# Patient Record
Sex: Male | Born: 1995
Health system: Southern US, Community
[De-identification: ages and names within clinical notes are randomized; demographics above are authoritative.]

## PROBLEM LIST (undated history)

## (undated) DIAGNOSIS — W3400XA Accidental discharge from unspecified firearms or gun, initial encounter: Secondary | ICD-10-CM

## (undated) HISTORY — PX: APPENDECTOMY: SHX54

---
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
## 2017-03-13 ENCOUNTER — Inpatient Hospital Stay (HOSPITAL_COMMUNITY)
Admission: EM | Admit: 2017-03-13 | Discharge: 2017-03-19 | DRG: 208 | Payer: Self-pay | Attending: General Surgery | Admitting: General Surgery

## 2017-03-13 ENCOUNTER — Inpatient Hospital Stay (HOSPITAL_COMMUNITY): Payer: Self-pay

## 2017-03-13 ENCOUNTER — Emergency Department (HOSPITAL_COMMUNITY): Payer: Self-pay

## 2017-03-13 ENCOUNTER — Encounter (HOSPITAL_COMMUNITY): Payer: Self-pay | Admitting: Emergency Medicine

## 2017-03-13 DIAGNOSIS — J939 Pneumothorax, unspecified: Secondary | ICD-10-CM

## 2017-03-13 DIAGNOSIS — D62 Acute posthemorrhagic anemia: Secondary | ICD-10-CM | POA: Diagnosis present

## 2017-03-13 DIAGNOSIS — S299XXA Unspecified injury of thorax, initial encounter: Secondary | ICD-10-CM

## 2017-03-13 DIAGNOSIS — I959 Hypotension, unspecified: Secondary | ICD-10-CM | POA: Diagnosis present

## 2017-03-13 DIAGNOSIS — S2241XA Multiple fractures of ribs, right side, initial encounter for closed fracture: Secondary | ICD-10-CM | POA: Diagnosis present

## 2017-03-13 DIAGNOSIS — S41131A Puncture wound without foreign body of right upper arm, initial encounter: Secondary | ICD-10-CM | POA: Diagnosis present

## 2017-03-13 DIAGNOSIS — S272XXA Traumatic hemopneumothorax, initial encounter: Principal | ICD-10-CM | POA: Diagnosis present

## 2017-03-13 DIAGNOSIS — S21131A Puncture wound without foreign body of right front wall of thorax without penetration into thoracic cavity, initial encounter: Secondary | ICD-10-CM | POA: Diagnosis present

## 2017-03-13 DIAGNOSIS — W3400XA Accidental discharge from unspecified firearms or gun, initial encounter: Secondary | ICD-10-CM

## 2017-03-13 DIAGNOSIS — J942 Hemothorax: Secondary | ICD-10-CM

## 2017-03-13 LAB — CBC
HCT: 43.1 % (ref 39.0–52.0)
HEMATOCRIT: 46.8 % (ref 39.0–52.0)
HEMOGLOBIN: 15.1 g/dL (ref 13.0–17.0)
HEMOGLOBIN: 14.3 g/dL (ref 13.0–17.0)
MCH: 28 pg (ref 26.0–34.0)
MCH: 28.3 pg (ref 26.0–34.0)
MCHC: 32.3 g/dL (ref 30.0–36.0)
MCHC: 33.2 g/dL (ref 30.0–36.0)
MCV: 86.8 fL (ref 78.0–100.0)
MCV: 85.2 fL (ref 78.0–100.0)
Platelets: 318 10*3/uL (ref 150–400)
Platelets: 281 10*3/uL (ref 150–400)
RBC: 5.39 MIL/uL (ref 4.22–5.81)
RBC: 5.06 MIL/uL (ref 4.22–5.81)
RDW: 13.2 % (ref 11.5–15.5)
RDW: 13.5 % (ref 11.5–15.5)
WBC: 12.3 10*3/uL — AB (ref 4.0–10.5)
WBC: 30.5 10*3/uL — AB (ref 4.0–10.5)

## 2017-03-13 LAB — COMPREHENSIVE METABOLIC PANEL
ALBUMIN: 4.3 g/dL (ref 3.5–5.0)
ALK PHOS: 79 U/L (ref 38–126)
ALT: 15 U/L — ABNORMAL LOW (ref 17–63)
ANION GAP: 22 — AB (ref 5–15)
AST: 33 U/L (ref 15–41)
BILIRUBIN TOTAL: 1.2 mg/dL (ref 0.3–1.2)
BUN: 15 mg/dL (ref 6–20)
CALCIUM: 9.1 mg/dL (ref 8.9–10.3)
CO2: 14 mmol/L — ABNORMAL LOW (ref 22–32)
Chloride: 105 mmol/L (ref 101–111)
Creatinine, Ser: 1.51 mg/dL — ABNORMAL HIGH (ref 0.61–1.24)
GFR calc non Af Amer: 27 mL/min — ABNORMAL LOW (ref >60)
GFR, EST AFRICAN AMERICAN: 31 mL/min — AB (ref >60)
GLUCOSE: 130 mg/dL — AB (ref 65–99)
POTASSIUM: 3.3 mmol/L — AB (ref 3.5–5.1)
Sodium: 141 mmol/L (ref 135–145)
TOTAL PROTEIN: 7.3 g/dL (ref 6.5–8.1)

## 2017-03-13 LAB — I-STAT CG4 LACTIC ACID, ED: LACTIC ACID, VENOUS: 13.37 mmol/L — AB (ref 0.5–1.9)

## 2017-03-13 LAB — TRIGLYCERIDES
TRIGLYCERIDES: 82 mg/dL (ref <150)
Triglycerides: 115 mg/dL (ref <150)

## 2017-03-13 LAB — I-STAT TROPONIN, ED: TROPONIN I, POC: 0.01 ng/mL (ref 0.00–0.08)

## 2017-03-13 LAB — PROTIME-INR
INR: 1.09
PROTHROMBIN TIME: 14.1 s (ref 11.4–15.2)

## 2017-03-13 LAB — I-STAT CHEM 8, ED
BUN: 26 mg/dL — AB (ref 6–20)
CALCIUM ION: 1.05 mmol/L — AB (ref 1.15–1.40)
Chloride: 105 mmol/L (ref 101–111)
Creatinine, Ser: 1.4 mg/dL — ABNORMAL HIGH (ref 0.61–1.24)
GLUCOSE: 135 mg/dL — AB (ref 65–99)
HCT: 47 % (ref 39.0–52.0)
Hemoglobin: 16 g/dL (ref 13.0–17.0)
Potassium: 5.2 mmol/L — ABNORMAL HIGH (ref 3.5–5.1)
SODIUM: 140 mmol/L (ref 135–145)
TCO2: 19 mmol/L (ref 0–100)

## 2017-03-13 LAB — ETHANOL

## 2017-03-13 LAB — CDS SEROLOGY

## 2017-03-13 LAB — POCT I-STAT 3, ART BLOOD GAS (G3+)
Acid-base deficit: 4 mmol/L — ABNORMAL HIGH (ref 0.0–2.0)
BICARBONATE: 22.7 mmol/L (ref 20.0–28.0)
O2 Saturation: 92 %
TCO2: 24 mmol/L (ref 0–100)
pCO2 arterial: 45.9 mmHg (ref 32.0–48.0)
pH, Arterial: 7.299 — ABNORMAL LOW (ref 7.350–7.450)
pO2, Arterial: 68 mmHg — ABNORMAL LOW (ref 83.0–108.0)

## 2017-03-13 LAB — ABO/RH: ABO/RH(D): AB POS

## 2017-03-13 MED ORDER — VECURONIUM BROMIDE 10 MG IV SOLR
INTRAVENOUS | Status: AC | PRN
  Administered 2017-03-13: 10 mg via INTRAVENOUS

## 2017-03-13 MED ORDER — ETOMIDATE 2 MG/ML IV SOLN
INTRAVENOUS | Status: AC | PRN
  Administered 2017-03-13: 20 mg via INTRAVENOUS

## 2017-03-13 MED ORDER — VECURONIUM BROMIDE 10 MG IV SOLR
INTRAVENOUS | Status: AC
  Filled 2017-03-13: qty 10

## 2017-03-13 MED ORDER — KCL IN DEXTROSE-NACL 20-5-0.45 MEQ/L-%-% IV SOLN
INTRAVENOUS | Status: DC
  Administered 2017-03-13 – 2017-03-18 (×3): via INTRAVENOUS
  Filled 2017-03-13 (×6): qty 1000

## 2017-03-13 MED ORDER — FENTANYL 2500MCG IN NS 250ML (10MCG/ML) PREMIX INFUSION
25.0000 ug/h | INTRAVENOUS | Status: DC
  Administered 2017-03-13: 25 ug/h via INTRAVENOUS
  Filled 2017-03-13: qty 250

## 2017-03-13 MED ORDER — PROPOFOL 1000 MG/100ML IV EMUL
INTRAVENOUS | Status: AC | PRN
  Administered 2017-03-13: 10 ug/kg/min via INTRAVENOUS

## 2017-03-13 MED ORDER — FENTANYL CITRATE (PF) 100 MCG/2ML IJ SOLN: 50.0000 ug | Freq: Once | INTRAMUSCULAR | Status: DC

## 2017-03-13 MED ORDER — PROPOFOL 1000 MG/100ML IV EMUL
0.0000 ug/kg/min | INTRAVENOUS | Status: DC
  Administered 2017-03-13 (×2): 25 ug/kg/min via INTRAVENOUS
  Administered 2017-03-14: 15 ug/kg/min via INTRAVENOUS
  Filled 2017-03-13 (×2): qty 100

## 2017-03-13 MED ORDER — SODIUM CHLORIDE 0.9 % IV BOLUS (SEPSIS)
1000.0000 mL | Freq: Once | INTRAVENOUS | Status: AC
  Administered 2017-03-13: 1000 mL via INTRAVENOUS

## 2017-03-13 MED ORDER — PROPOFOL 1000 MG/100ML IV EMUL
INTRAVENOUS | Status: AC
  Filled 2017-03-13: qty 100

## 2017-03-13 MED ORDER — ONDANSETRON HCL 4 MG PO TABS: 4.0000 mg | ORAL_TABLET | Freq: Four times a day (QID) | ORAL | Status: DC | PRN

## 2017-03-13 MED ORDER — FENTANYL 2500MCG IN NS 250ML (10MCG/ML) PREMIX INFUSION
25.0000 ug/h | INTRAVENOUS | Status: DC
  Administered 2017-03-13: 75 ug/h via INTRAVENOUS
  Administered 2017-03-14: 150 ug/h via INTRAVENOUS
  Filled 2017-03-13: qty 250

## 2017-03-13 MED ORDER — PANTOPRAZOLE SODIUM 40 MG PO TBEC
40.0000 mg | DELAYED_RELEASE_TABLET | Freq: Every day | ORAL | Status: DC
  Administered 2017-03-16 – 2017-03-19 (×4): 40 mg via ORAL
  Filled 2017-03-13 (×4): qty 1

## 2017-03-13 MED ORDER — SODIUM CHLORIDE 0.9 % IV SOLN
INTRAVENOUS | Status: AC | PRN
  Administered 2017-03-13: 1000 mL via INTRAVENOUS

## 2017-03-13 MED ORDER — PROPOFOL 1000 MG/100ML IV EMUL
0.0000 ug/kg/min | INTRAVENOUS | Status: DC
  Administered 2017-03-13: 15 ug/kg/min via INTRAVENOUS

## 2017-03-13 MED ORDER — FENTANYL BOLUS VIA INFUSION
50.0000 ug | INTRAVENOUS | Status: DC | PRN
  Filled 2017-03-13: qty 50

## 2017-03-13 MED ORDER — PANTOPRAZOLE SODIUM 40 MG IV SOLR
40.0000 mg | Freq: Every day | INTRAVENOUS | Status: DC
  Administered 2017-03-14 – 2017-03-15 (×2): 40 mg via INTRAVENOUS
  Filled 2017-03-13 (×2): qty 40

## 2017-03-13 MED ORDER — IOPAMIDOL (ISOVUE-300) INJECTION 61%
100.0000 mL | Freq: Once | INTRAVENOUS | Status: AC | PRN
  Administered 2017-03-13: 100 mL via INTRAVENOUS

## 2017-03-13 MED ORDER — ORAL CARE MOUTH RINSE
15.0000 mL | Freq: Four times a day (QID) | OROMUCOSAL | Status: DC
  Administered 2017-03-14 (×2): 15 mL via OROMUCOSAL

## 2017-03-13 MED ORDER — ONDANSETRON HCL 4 MG/2ML IJ SOLN
4.0000 mg | Freq: Four times a day (QID) | INTRAMUSCULAR | Status: DC | PRN
  Administered 2017-03-17: 4 mg via INTRAVENOUS
  Filled 2017-03-13: qty 2

## 2017-03-13 MED ORDER — SUCCINYLCHOLINE CHLORIDE 20 MG/ML IJ SOLN
INTRAMUSCULAR | Status: AC | PRN
  Administered 2017-03-13: 120 mg via INTRAVENOUS

## 2017-03-13 MED ORDER — CHLORHEXIDINE GLUCONATE 0.12% ORAL RINSE (MEDLINE KIT)
15.0000 mL | Freq: Two times a day (BID) | OROMUCOSAL | Status: DC
  Administered 2017-03-13 – 2017-03-19 (×8): 15 mL via OROMUCOSAL

## 2017-03-13 MED ORDER — STERILE WATER FOR INJECTION IJ SOLN
INTRAMUSCULAR | Status: AC
  Filled 2017-03-13: qty 10

## 2017-03-13 MED ORDER — FENTANYL BOLUS VIA INFUSION
50.0000 ug | INTRAVENOUS | Status: DC | PRN
  Administered 2017-03-14: 50 ug via INTRAVENOUS
  Filled 2017-03-13: qty 50

## 2017-03-13 NOTE — ED Notes (Signed)
Plasma finished.  
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
## 2017-03-13 NOTE — Progress Notes (Signed)
RT ran ABG using Istat which will not transmit the data.  POC has been alerted.  Results given to RN at 18:55.  ABG results: PH 7.299 CO2 45.9 O2 68 HCO3 22.7 Sat 92

## 2017-03-13 NOTE — ED Notes (Signed)
Second unit of blood finished.

## 2017-03-13 NOTE — ED Notes (Signed)
First unit of blood finished
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
## 2017-03-13 NOTE — ED Notes (Signed)
Unit of plasma started [id number] 18 [id number]
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
## 2017-03-13 NOTE — ED Notes (Signed)
Unit of plasma started [id number] 18 [id number]
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
## 2017-03-13 NOTE — Progress Notes (Signed)
Pt transported on vent from ED to 2H19 without complications.  Vitals remained stable.

## 2017-03-13 NOTE — ED Notes (Signed)
Pt given bolus from fentanyl drip
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
## 2017-03-13 NOTE — ED Notes (Signed)
[facility]

## 2017-03-13 NOTE — ED Notes (Signed)
Unit of blood started [id number]

## 2017-03-13 NOTE — ED Notes (Signed)
Second unit of blood started [id number]

## 2017-03-14 ENCOUNTER — Inpatient Hospital Stay (HOSPITAL_COMMUNITY): Payer: Self-pay

## 2017-03-14 LAB — BASIC METABOLIC PANEL
ANION GAP: 9 (ref 5–15)
BUN: 14 mg/dL (ref 6–20)
CHLORIDE: 107 mmol/L (ref 101–111)
CO2: 23 mmol/L (ref 22–32)
Calcium: 8.1 mg/dL — ABNORMAL LOW (ref 8.9–10.3)
Creatinine, Ser: 0.93 mg/dL (ref 0.61–1.24)
GFR calc non Af Amer: >60 mL/min (ref >60)
Glucose, Bld: 119 mg/dL — ABNORMAL HIGH (ref 65–99)
POTASSIUM: 4.3 mmol/L (ref 3.5–5.1)
SODIUM: 139 mmol/L (ref 135–145)

## 2017-03-14 LAB — TYPE AND SCREEN
ABO/RH(D): AB POS
Antibody Screen: NEGATIVE
UNIT DIVISION: 0
UNIT DIVISION: 0
Unit division: 0
Unit division: 0

## 2017-03-14 LAB — URINALYSIS, ROUTINE W REFLEX MICROSCOPIC
BILIRUBIN URINE: NEGATIVE
Bacteria, UA: NONE SEEN
Glucose, UA: NEGATIVE mg/dL
Hgb urine dipstick: NEGATIVE
KETONES UR: NEGATIVE mg/dL
Leukocytes, UA: NEGATIVE
Nitrite: NEGATIVE
PROTEIN: NEGATIVE mg/dL
SQUAMOUS EPITHELIAL / LPF: NONE SEEN
Specific Gravity, Urine: 1.038 — ABNORMAL HIGH (ref 1.005–1.030)
pH: 5 (ref 5.0–8.0)

## 2017-03-14 LAB — BPAM RBC
BLOOD PRODUCT EXPIRATION DATE: 201804022359
BLOOD PRODUCT EXPIRATION DATE: 201804032359
Blood Product Expiration Date: 201804022359
Blood Product Expiration Date: 201804032359
ISSUE DATE / TIME: 201803271609
ISSUE DATE / TIME: 201803271601
ISSUE DATE / TIME: 201803271601
ISSUE DATE / TIME: 201803280313
UNIT TYPE AND RH: 9500
Unit Type and Rh: 9500
Unit Type and Rh: 9500
Unit Type and Rh: 9500

## 2017-03-14 LAB — BPAM FFP
BLOOD PRODUCT EXPIRATION DATE: 201803312359
BLOOD PRODUCT EXPIRATION DATE: 201803312359
ISSUE DATE / TIME: 201803271602
ISSUE DATE / TIME: 201803271602
UNIT TYPE AND RH: 6200
Unit Type and Rh: 6200

## 2017-03-14 LAB — CBC
HEMATOCRIT: 38.2 % — AB (ref 39.0–52.0)
HEMOGLOBIN: 12.9 g/dL — AB (ref 13.0–17.0)
MCH: 28.5 pg (ref 26.0–34.0)
MCHC: 33.8 g/dL (ref 30.0–36.0)
MCV: 84.3 fL (ref 78.0–100.0)
Platelets: 247 10*3/uL (ref 150–400)
RBC: 4.53 MIL/uL (ref 4.22–5.81)
RDW: 13.9 % (ref 11.5–15.5)
WBC: 22.3 10*3/uL — AB (ref 4.0–10.5)

## 2017-03-14 LAB — PREPARE FRESH FROZEN PLASMA
UNIT DIVISION: 0
Unit division: 0

## 2017-03-14 LAB — MRSA PCR SCREENING: MRSA BY PCR: NEGATIVE

## 2017-03-14 LAB — BLOOD PRODUCT ORDER (VERBAL) VERIFICATION

## 2017-03-14 MED ORDER — HYDROMORPHONE HCL 1 MG/ML IJ SOLN
1.0000 mg | INTRAMUSCULAR | Status: DC | PRN
  Administered 2017-03-14 – 2017-03-15 (×6): 2 mg via INTRAVENOUS
  Administered 2017-03-16: 1 mg via INTRAVENOUS
  Administered 2017-03-16: 2 mg via INTRAVENOUS
  Administered 2017-03-16 (×2): 1 mg via INTRAVENOUS
  Administered 2017-03-17: 2 mg via INTRAVENOUS
  Administered 2017-03-17 (×2): 1 mg via INTRAVENOUS
  Administered 2017-03-17: 2 mg via INTRAVENOUS
  Administered 2017-03-18 (×3): 1 mg via INTRAVENOUS
  Filled 2017-03-14: qty 1
  Filled 2017-03-14 (×2): qty 2
  Filled 2017-03-14: qty 1
  Filled 2017-03-14 (×3): qty 2
  Filled 2017-03-14: qty 1
  Filled 2017-03-14 (×4): qty 2
  Filled 2017-03-14 (×2): qty 1
  Filled 2017-03-14: qty 2
  Filled 2017-03-14 (×3): qty 1

## 2017-03-14 NOTE — Procedures (Signed)
Extubation Procedure Note  Patient Details:   Name: Ryan Downs DOB: 07/13/1996 MRN: [id number]   Airway Documentation:     Evaluation  O2 sats: stable throughout Complications: No apparent complications Patient did tolerate procedure well. Bilateral Breath Sounds: Clear   Yes   Patient extubated to 2L nasal cannula per MD order.  Positive cuff leak noted.  No evidence of stridor.  Patient able to speak post extubation.  Sats currently 100%.  Vitals are stable.  No complications noted.   Ledell NossBrown, Shaelin Lalley N 03/14/2017, 9:05 AM
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
## 2017-03-15 ENCOUNTER — Inpatient Hospital Stay (HOSPITAL_COMMUNITY): Payer: Self-pay

## 2017-03-15 LAB — BASIC METABOLIC PANEL
ANION GAP: 5 (ref 5–15)
BUN: <5 mg/dL — ABNORMAL LOW (ref 6–20)
CHLORIDE: 102 mmol/L (ref 101–111)
CO2: 27 mmol/L (ref 22–32)
Calcium: 8.3 mg/dL — ABNORMAL LOW (ref 8.9–10.3)
Creatinine, Ser: 0.76 mg/dL (ref 0.61–1.24)
GFR calc non Af Amer: >60 mL/min (ref >60)
GLUCOSE: 136 mg/dL — AB (ref 65–99)
Potassium: 4 mmol/L (ref 3.5–5.1)
Sodium: 134 mmol/L — ABNORMAL LOW (ref 135–145)

## 2017-03-15 LAB — CBC WITH DIFFERENTIAL/PLATELET
BASOS ABS: 0 10*3/uL (ref 0.0–0.1)
BASOS PCT: 0 %
Eosinophils Absolute: 0.1 10*3/uL (ref 0.0–0.7)
Eosinophils Relative: 1 %
HEMATOCRIT: 34.5 % — AB (ref 39.0–52.0)
HEMOGLOBIN: 11.3 g/dL — AB (ref 13.0–17.0)
LYMPHS PCT: 12 %
Lymphs Abs: 1.9 10*3/uL (ref 0.7–4.0)
MCH: 27.6 pg (ref 26.0–34.0)
MCHC: 32.8 g/dL (ref 30.0–36.0)
MCV: 84.4 fL (ref 78.0–100.0)
Monocytes Absolute: 1.9 10*3/uL — ABNORMAL HIGH (ref 0.1–1.0)
Monocytes Relative: 12 %
NEUTROS ABS: 12.2 10*3/uL — AB (ref 1.7–7.7)
NEUTROS PCT: 75 %
Platelets: 220 10*3/uL (ref 150–400)
RBC: 4.09 MIL/uL — ABNORMAL LOW (ref 4.22–5.81)
RDW: 13.6 % (ref 11.5–15.5)
WBC: 16.1 10*3/uL — ABNORMAL HIGH (ref 4.0–10.5)

## 2017-03-15 MED ORDER — OXYCODONE HCL 5 MG PO TABS
5.0000 mg | ORAL_TABLET | ORAL | Status: DC | PRN
  Administered 2017-03-15 – 2017-03-16 (×4): 10 mg via ORAL
  Administered 2017-03-16: 15 mg via ORAL
  Administered 2017-03-16: 10 mg via ORAL
  Administered 2017-03-16: 15 mg via ORAL
  Administered 2017-03-16: 10 mg via ORAL
  Administered 2017-03-17 – 2017-03-19 (×12): 15 mg via ORAL
  Filled 2017-03-15: qty 2
  Filled 2017-03-15 (×3): qty 3
  Filled 2017-03-15: qty 2
  Filled 2017-03-15 (×5): qty 3
  Filled 2017-03-15: qty 2
  Filled 2017-03-15: qty 3
  Filled 2017-03-15: qty 2
  Filled 2017-03-15 (×2): qty 3
  Filled 2017-03-15 (×2): qty 2
  Filled 2017-03-15: qty 3
  Filled 2017-03-15: qty 2
  Filled 2017-03-15 (×2): qty 3

## 2017-03-15 NOTE — Progress Notes (Signed)
Report called to 6N
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
## 2017-03-16 ENCOUNTER — Inpatient Hospital Stay (HOSPITAL_COMMUNITY): Payer: Self-pay

## 2017-03-16 LAB — CBC
HCT: 35.8 % — ABNORMAL LOW (ref 39.0–52.0)
Hemoglobin: 11.6 g/dL — ABNORMAL LOW (ref 13.0–17.0)
MCH: 27.1 pg (ref 26.0–34.0)
MCHC: 32.4 g/dL (ref 30.0–36.0)
MCV: 83.6 fL (ref 78.0–100.0)
PLATELETS: 229 10*3/uL (ref 150–400)
RBC: 4.28 MIL/uL (ref 4.22–5.81)
RDW: 13 % (ref 11.5–15.5)
WBC: 15.6 10*3/uL — ABNORMAL HIGH (ref 4.0–10.5)

## 2017-03-16 LAB — TRIGLYCERIDES: TRIGLYCERIDES: 96 mg/dL (ref <150)
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
## 2017-03-17 ENCOUNTER — Inpatient Hospital Stay (HOSPITAL_COMMUNITY): Payer: Self-pay

## 2017-03-17 MED ORDER — POLYETHYLENE GLYCOL 3350 17 G PO PACK
17.0000 g | PACK | Freq: Every day | ORAL | Status: DC
  Administered 2017-03-18: 17 g via ORAL
  Filled 2017-03-17 (×2): qty 1

## 2017-03-17 MED ORDER — WHITE PETROLATUM GEL
Status: DC | PRN
  Administered 2017-03-17: 20:00:00 via TOPICAL
  Filled 2017-03-17: qty 1

## 2017-03-17 NOTE — Progress Notes (Signed)
Patient gait was a little unstable, will continue to use walker while ambulating until gait stables.  [name], [place] [address]
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
## 2017-03-18 LAB — CBC
HEMATOCRIT: 31 % — AB (ref 39.0–52.0)
Hemoglobin: 10.6 g/dL — ABNORMAL LOW (ref 13.0–17.0)
MCH: 27.9 pg (ref 26.0–34.0)
MCHC: 34.2 g/dL (ref 30.0–36.0)
MCV: 81.6 fL (ref 78.0–100.0)
Platelets: 303 10*3/uL (ref 150–400)
RBC: 3.8 MIL/uL — ABNORMAL LOW (ref 4.22–5.81)
RDW: 13 % (ref 11.5–15.5)
WBC: 14.7 10*3/uL — ABNORMAL HIGH (ref 4.0–10.5)
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
## 2017-03-19 ENCOUNTER — Emergency Department (HOSPITAL_COMMUNITY)
Admission: EM | Admit: 2017-03-19 | Discharge: 2017-03-19 | Disposition: A | Discharge status: Home / Self Care | Attending: Emergency Medicine | Admitting: Emergency Medicine

## 2017-03-19 ENCOUNTER — Encounter (HOSPITAL_COMMUNITY): Payer: Self-pay

## 2017-03-19 ENCOUNTER — Emergency Department (HOSPITAL_COMMUNITY)

## 2017-03-19 DIAGNOSIS — R091 Pleurisy: Secondary | ICD-10-CM | POA: Insufficient documentation

## 2017-03-19 DIAGNOSIS — Z87891 Personal history of nicotine dependence: Secondary | ICD-10-CM | POA: Insufficient documentation

## 2017-03-19 MED ORDER — OXYCODONE-ACETAMINOPHEN 5-325 MG PO TABS
1.0000 | ORAL_TABLET | Freq: Once | ORAL | Status: AC
  Administered 2017-03-19: 1 via ORAL
  Filled 2017-03-19: qty 1

## 2017-03-19 MED ORDER — KETOROLAC TROMETHAMINE 30 MG/ML IJ SOLN
30.0000 mg | Freq: Once | INTRAMUSCULAR | Status: AC
  Administered 2017-03-19: 30 mg via INTRAVENOUS
  Filled 2017-03-19: qty 1

## 2017-03-19 MED ORDER — OXYCODONE HCL 5 MG PO TABS: 5.0000 mg | ORAL_TABLET | ORAL | 0 refills | Status: DC | PRN
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
## 2017-03-19 NOTE — ED Notes (Signed)
Got patient on the monitor got vitals patient resting
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
## 2017-03-19 NOTE — Discharge Instructions (Signed)
You may shower tomorrow and remove your bandages after showering. Replace them daily after showering. Keep your wounds clean and dry.   Follow up in our office in 2 weeks. Call to schedule an appointment.   Call our office if you experience worsening cough, fever, chills, redness, swelling or foul discharge from your wounds.    1. PAIN CONTROL:  1. Pain is best controlled by a usual combination of three different methods TOGETHER:  1. Ice/Heat 2. Over the counter pain medication 3. Prescription pain medication 2. It is helpful to take an over-the-counter pain medication regularly for the first few weeks.  1. Acetaminophen (Tylenol, etc) 500-650mg  four times a day (every meal & bedtime) 3. A prescription for pain medication (such as oxycodone, hydrocodone, etc) should be given to you upon discharge. Take your pain medication as prescribed.  1. If you are having problems/concerns with the prescription medicine (does not control pain, nausea, vomiting, rash, itching, etc), please call us [phone] to see if we need to switch you to a different pain medicine that will work better for you and/or control your side effect better. 2. If you need a refill on your pain medication, please contact your pharmacy. They will contact our office to request authorization. Prescriptions will not be filled after 5 pm or on week-ends. 4. Avoid getting constipated. While taking narcotic pain medication it is common to experience some constipation. Increasing fluid intake and taking a fiber supplement (such as Metamucil, Citrucel, FiberCon, MiraLax, etc) 1-2 times a day regularly will usually help prevent this problem from occurring. A mild laxative (prune juice, Milk of Magnesia, MiraLax, etc) should be taken according to package directions if there are no bowel movements after 48 hours.    5. FOLLOW UP in our office  1. Please call [facility] at [phone] to set up an appointment in approximately 2-3 weeks.    10. IF YOU HAVE DISABILITY OR FAMILY LEAVE FORMS, BRING THEM TO THE               OFFICE FOR PROCESSING.   WHEN TO CALL us [phone]:  1. Poor pain control 2. Reactions / problems with new medications (rash/itching, nausea, etc)  3. Fever over 101.5 F (38.5 C) 4. Inability to urinate 5. Nausea and/or vomiting 6. Worsening swelling or bruising 7. Continued bleeding from incision. 8. Increased pain, redness, or drainage from wounds  The clinic staff is available to answer your questions during regular business hours (8:30am-5pm). Please dont hesitate to call and ask to speak to one of our nurses for clinical concerns.  If you have a medical emergency, go to the nearest emergency room or call 911.  A surgeon from [facility] is always on call at the [facility], [place]  [address], [place], [place] [address] ?  MAIN: [phone] ? TOLL FREE: [phone] ?  FAX [phone]  [email or web address]
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
## 2017-04-02 ENCOUNTER — Encounter (HOSPITAL_COMMUNITY): Payer: Self-pay | Admitting: Emergency Medicine

## 2017-04-02 ENCOUNTER — Emergency Department (HOSPITAL_COMMUNITY)
Admission: EM | Admit: 2017-04-02 | Discharge: 2017-04-03 | Payer: Self-pay | Attending: Emergency Medicine | Admitting: Emergency Medicine

## 2017-04-02 ENCOUNTER — Emergency Department (HOSPITAL_COMMUNITY): Payer: Self-pay

## 2017-04-02 DIAGNOSIS — Z4801 Encounter for change or removal of surgical wound dressing: Secondary | ICD-10-CM | POA: Insufficient documentation

## 2017-04-02 DIAGNOSIS — Z87891 Personal history of nicotine dependence: Secondary | ICD-10-CM | POA: Insufficient documentation

## 2017-04-02 DIAGNOSIS — Z5189 Encounter for other specified aftercare: Secondary | ICD-10-CM

## 2017-04-02 HISTORY — DX: Accidental discharge from unspecified firearms or gun, initial encounter: W34.00XA

## 2017-04-02 LAB — I-STAT TROPONIN, ED: TROPONIN I, POC: 0 ng/mL (ref 0.00–0.08)

## 2017-04-02 LAB — BASIC METABOLIC PANEL
Anion gap: 10 (ref 5–15)
BUN: 8 mg/dL (ref 6–20)
CALCIUM: 9.7 mg/dL (ref 8.9–10.3)
CO2: 23 mmol/L (ref 22–32)
CREATININE: 0.97 mg/dL (ref 0.61–1.24)
Chloride: 103 mmol/L (ref 101–111)
GFR calc Af Amer: >60 mL/min (ref >60)
GLUCOSE: 96 mg/dL (ref 65–99)
Potassium: 4 mmol/L (ref 3.5–5.1)
Sodium: 136 mmol/L (ref 135–145)

## 2017-04-02 LAB — CBC
HCT: 39.4 % (ref 39.0–52.0)
Hemoglobin: 13.2 g/dL (ref 13.0–17.0)
MCH: 27.8 pg (ref 26.0–34.0)
MCHC: 33.5 g/dL (ref 30.0–36.0)
MCV: 83.1 fL (ref 78.0–100.0)
PLATELETS: 549 10*3/uL — AB (ref 150–400)
RBC: 4.74 MIL/uL (ref 4.22–5.81)
RDW: 13.7 % (ref 11.5–15.5)
WBC: 10.6 10*3/uL — ABNORMAL HIGH (ref 4.0–10.5)

## 2017-04-02 MED ORDER — CLINDAMYCIN HCL 300 MG PO CAPS: 300.0000 mg | ORAL_CAPSULE | Freq: Four times a day (QID) | ORAL | 0 refills | Status: AC

## 2017-04-02 MED ORDER — IBUPROFEN 400 MG PO TABS
400.0000 mg | ORAL_TABLET | Freq: Once | ORAL | Status: AC
  Administered 2017-04-02: 400 mg via ORAL
  Filled 2017-04-02: qty 1

## 2017-04-02 MED ORDER — IOPAMIDOL (ISOVUE-300) INJECTION 61%
INTRAVENOUS | Status: AC
  Administered 2017-04-02: 75 mL
  Filled 2017-04-02: qty 75

## 2017-04-02 MED ORDER — CLINDAMYCIN HCL 150 MG PO CAPS
300.0000 mg | ORAL_CAPSULE | Freq: Once | ORAL | Status: AC
  Administered 2017-04-02: 300 mg via ORAL
  Filled 2017-04-02: qty 2

## 2017-04-02 MED ORDER — MELOXICAM 7.5 MG PO TABS: 7.5000 mg | ORAL_TABLET | Freq: Every day | ORAL | 0 refills | Status: AC
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
## 2017-04-02 NOTE — ED Notes (Signed)
Patient transported to CT 
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
## 2018-03-14 IMAGING — CR DG CHEST 1V PORT
1 series · 1 of 1 positions shown · non-contrast
Comparison: Chest radiograph and chest CT March 13, 2017

CLINICAL DATA: Hypoxia

EXAM:
PORTABLE CHEST 1 VIEW

[AP]
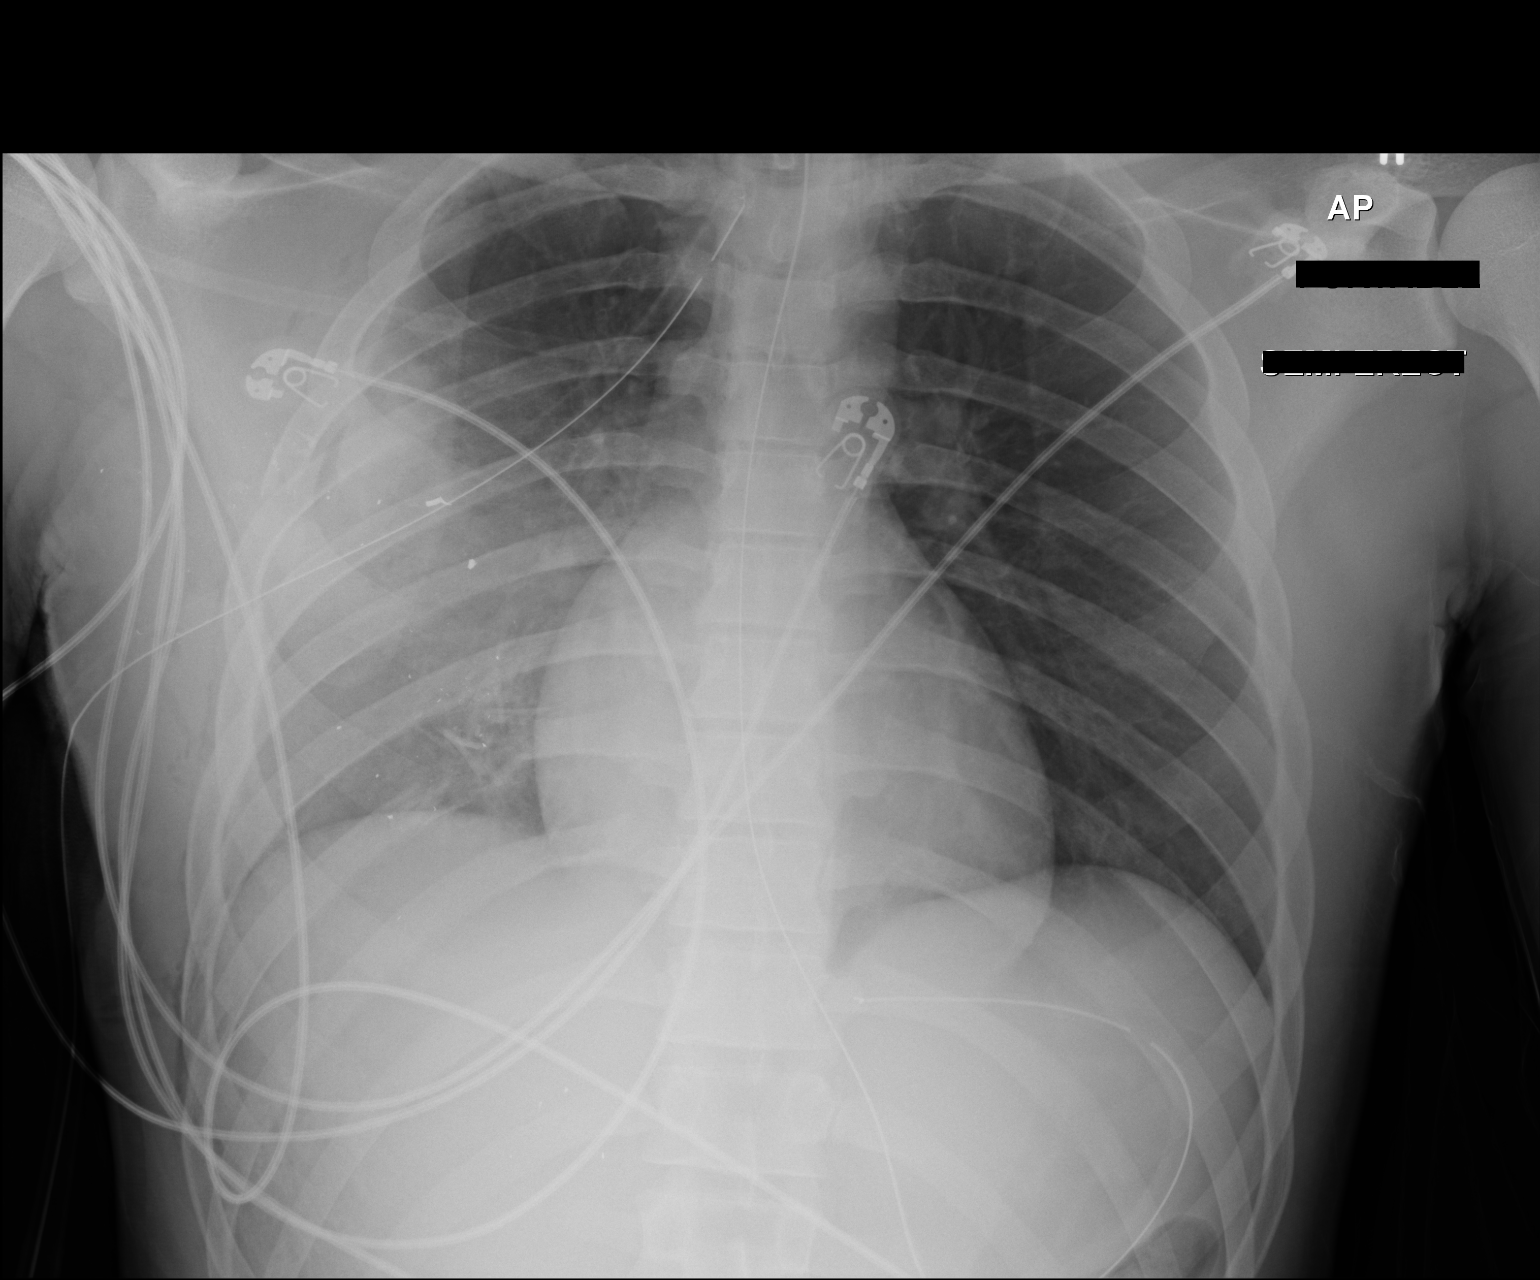

[1 of 1 positions shown; findings below may reference images not displayed]

FINDINGS: Endotracheal tube tip is 5.7 cm above the carina. Chest tube is
present on the right. Nasogastric tube tip and side port are in the
stomach. There is a small lateral pneumothorax on the right without
tension component. There is moderate consolidation throughout much
of the right mid lower lung zones, essentially stable. There is a
small right pleural effusion. There are metallic fragments on the
right.

Left lung is clear. Heart size and pulmonary vascularity are normal.
No adenopathy. Subcutaneous air on the right is again noted.
Comminuted posterior right ninth rib fracture present.
IMPRESSION: Tube and catheter positions as described. Small right-sided
pneumothorax. Small right pleural effusion as well.
Consolidations/contusion on the right stable. Stable comminuted
fracture posterior right ninth rib. Left lung clear. Stable cardiac
silhouette.
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
## 2020-08-18 DIAGNOSIS — W3400XA Accidental discharge from unspecified firearms or gun, initial encounter: Secondary | ICD-10-CM

## 2020-08-18 HISTORY — DX: Accidental discharge from unspecified firearms or gun, initial encounter: W34.00XA

## 2020-08-23 ENCOUNTER — Inpatient Hospital Stay (HOSPITAL_COMMUNITY): Payer: Self-pay

## 2020-08-23 ENCOUNTER — Encounter (HOSPITAL_COMMUNITY): Payer: Self-pay | Admitting: *Deleted

## 2020-08-23 ENCOUNTER — Inpatient Hospital Stay (HOSPITAL_COMMUNITY): Payer: Self-pay | Admitting: Registered Nurse

## 2020-08-23 ENCOUNTER — Inpatient Hospital Stay (HOSPITAL_COMMUNITY)
Admission: EM | Admit: 2020-08-23 | Discharge: 2020-08-25 | DRG: 481 | Disposition: A | Discharge status: Home Health | Payer: Self-pay | Attending: Student | Admitting: Student

## 2020-08-23 ENCOUNTER — Emergency Department (HOSPITAL_COMMUNITY): Payer: Self-pay

## 2020-08-23 ENCOUNTER — Encounter (HOSPITAL_COMMUNITY)
Admission: EM | Disposition: A | Discharge status: Home Health | Payer: Self-pay | Source: Home / Self Care | Attending: Student

## 2020-08-23 ENCOUNTER — Other Ambulatory Visit: Payer: Self-pay

## 2020-08-23 DIAGNOSIS — Z20822 Contact with and (suspected) exposure to covid-19: Secondary | ICD-10-CM | POA: Diagnosis present

## 2020-08-23 DIAGNOSIS — W3400XA Accidental discharge from unspecified firearms or gun, initial encounter: Secondary | ICD-10-CM

## 2020-08-23 DIAGNOSIS — S7291XA Unspecified fracture of right femur, initial encounter for closed fracture: Secondary | ICD-10-CM

## 2020-08-23 DIAGNOSIS — Z419 Encounter for procedure for purposes other than remedying health state, unspecified: Secondary | ICD-10-CM

## 2020-08-23 DIAGNOSIS — T1490XA Injury, unspecified, initial encounter: Secondary | ICD-10-CM

## 2020-08-23 DIAGNOSIS — F17200 Nicotine dependence, unspecified, uncomplicated: Secondary | ICD-10-CM | POA: Diagnosis present

## 2020-08-23 DIAGNOSIS — S7290XA Unspecified fracture of unspecified femur, initial encounter for closed fracture: Secondary | ICD-10-CM

## 2020-08-23 DIAGNOSIS — S7221XA Displaced subtrochanteric fracture of right femur, initial encounter for closed fracture: Principal | ICD-10-CM

## 2020-08-23 DIAGNOSIS — D62 Acute posthemorrhagic anemia: Secondary | ICD-10-CM | POA: Diagnosis not present

## 2020-08-23 DIAGNOSIS — S81831A Puncture wound without foreign body, right lower leg, initial encounter: Secondary | ICD-10-CM

## 2020-08-23 DIAGNOSIS — S0285XA Fracture of orbit, unspecified, initial encounter for closed fracture: Secondary | ICD-10-CM

## 2020-08-23 DIAGNOSIS — S01112A Laceration without foreign body of left eyelid and periocular area, initial encounter: Secondary | ICD-10-CM

## 2020-08-23 HISTORY — DX: Accidental discharge from unspecified firearms or gun, initial encounter: W34.00XA

## 2020-08-23 HISTORY — PX: FEMUR IM NAIL: SHX1597

## 2020-08-23 LAB — CBC
HCT: 44.5 % (ref 39.0–52.0)
HCT: 37.6 % — ABNORMAL LOW (ref 39.0–52.0)
Hemoglobin: 14.4 g/dL (ref 13.0–17.0)
Hemoglobin: 12 g/dL — ABNORMAL LOW (ref 13.0–17.0)
MCH: 27.9 pg (ref 26.0–34.0)
MCH: 27.1 pg (ref 26.0–34.0)
MCHC: 32.4 g/dL (ref 30.0–36.0)
MCHC: 31.9 g/dL (ref 30.0–36.0)
MCV: 86.1 fL (ref 80.0–100.0)
MCV: 84.9 fL (ref 80.0–100.0)
Platelets: 287 10*3/uL (ref 150–400)
Platelets: 239 10*3/uL (ref 150–400)
RBC: 5.17 MIL/uL (ref 4.22–5.81)
RBC: 4.43 MIL/uL (ref 4.22–5.81)
RDW: 12.7 % (ref 11.5–15.5)
RDW: 12.7 % (ref 11.5–15.5)
WBC: 17.3 10*3/uL — ABNORMAL HIGH (ref 4.0–10.5)
WBC: 15.7 10*3/uL — ABNORMAL HIGH (ref 4.0–10.5)
nRBC: 0 % (ref 0.0–0.2)
nRBC: 0 % (ref 0.0–0.2)

## 2020-08-23 LAB — COMPREHENSIVE METABOLIC PANEL
ALT: 13 U/L (ref 0–44)
AST: 24 U/L (ref 15–41)
Albumin: 4.2 g/dL (ref 3.5–5.0)
Alkaline Phosphatase: 77 U/L (ref 38–126)
Anion gap: 16 — ABNORMAL HIGH (ref 5–15)
BUN: 11 mg/dL (ref 6–20)
CO2: 19 mmol/L — ABNORMAL LOW (ref 22–32)
Calcium: 9 mg/dL (ref 8.9–10.3)
Chloride: 105 mmol/L (ref 98–111)
Creatinine, Ser: 1.32 mg/dL — ABNORMAL HIGH (ref 0.61–1.24)
GFR calc Af Amer: >60 mL/min (ref >60)
GFR calc non Af Amer: >60 mL/min (ref >60)
Glucose, Bld: 143 mg/dL — ABNORMAL HIGH (ref 70–99)
Potassium: 3.1 mmol/L — ABNORMAL LOW (ref 3.5–5.1)
Sodium: 140 mmol/L (ref 135–145)
Total Bilirubin: 1.3 mg/dL — ABNORMAL HIGH (ref 0.3–1.2)
Total Protein: 6.7 g/dL (ref 6.5–8.1)

## 2020-08-23 LAB — I-STAT CHEM 8, ED
BUN: 13 mg/dL (ref 6–20)
Calcium, Ion: 0.93 mmol/L — ABNORMAL LOW (ref 1.15–1.40)
Chloride: 106 mmol/L (ref 98–111)
Creatinine, Ser: 1 mg/dL (ref 0.61–1.24)
Glucose, Bld: 132 mg/dL — ABNORMAL HIGH (ref 70–99)
HCT: 44 % (ref 39.0–52.0)
Hemoglobin: 15 g/dL (ref 13.0–17.0)
Potassium: 3.2 mmol/L — ABNORMAL LOW (ref 3.5–5.1)
Sodium: 140 mmol/L (ref 135–145)
TCO2: 20 mmol/L — ABNORMAL LOW (ref 22–32)

## 2020-08-23 LAB — LACTIC ACID, PLASMA: Lactic Acid, Venous: 3.3 mmol/L (ref 0.5–1.9)

## 2020-08-23 LAB — SURGICAL PCR SCREEN
MRSA, PCR: NEGATIVE
Staphylococcus aureus: NEGATIVE

## 2020-08-23 LAB — SARS CORONAVIRUS 2 BY RT PCR (HOSPITAL ORDER, PERFORMED IN ~~LOC~~ HOSPITAL LAB): SARS Coronavirus 2: NEGATIVE

## 2020-08-23 LAB — PROTIME-INR
INR: 1.2 (ref 0.8–1.2)
Prothrombin Time: 14.4 seconds (ref 11.4–15.2)

## 2020-08-23 LAB — SAMPLE TO BLOOD BANK

## 2020-08-23 LAB — ETHANOL: Alcohol, Ethyl (B): <10 mg/dL (ref <10)

## 2020-08-23 LAB — CREATININE, SERUM
Creatinine, Ser: 0.91 mg/dL (ref 0.61–1.24)
GFR calc Af Amer: >60 mL/min (ref >60)
GFR calc non Af Amer: >60 mL/min (ref >60)

## 2020-08-23 SURGERY — INSERTION, INTRAMEDULLARY ROD, FEMUR
Anesthesia: General | Laterality: Right

## 2020-08-23 MED ORDER — LACTATED RINGERS IV SOLN: INTRAVENOUS | Status: DC | PRN

## 2020-08-23 MED ORDER — OXYCODONE HCL 5 MG PO TABS
5.0000 mg | ORAL_TABLET | ORAL | Status: DC | PRN
  Administered 2020-08-23: 10 mg via ORAL
  Administered 2020-08-23 – 2020-08-25 (×2): 5 mg via ORAL
  Administered 2020-08-25: 10 mg via ORAL
  Administered 2020-08-25: 5 mg via ORAL
  Filled 2020-08-23 (×2): qty 2
  Filled 2020-08-23: qty 1
  Filled 2020-08-23 (×3): qty 2

## 2020-08-23 MED ORDER — MIDAZOLAM HCL 5 MG/5ML IJ SOLN
INTRAMUSCULAR | Status: DC | PRN
  Administered 2020-08-23: 2 mg via INTRAVENOUS

## 2020-08-23 MED ORDER — ACETAMINOPHEN 325 MG PO TABS
650.0000 mg | ORAL_TABLET | Freq: Four times a day (QID) | ORAL | Status: DC
  Administered 2020-08-23 – 2020-08-25 (×9): 650 mg via ORAL
  Filled 2020-08-23 (×9): qty 2

## 2020-08-23 MED ORDER — VANCOMYCIN HCL 1000 MG IV SOLR
INTRAVENOUS | Status: AC
  Filled 2020-08-23: qty 1000

## 2020-08-23 MED ORDER — METHOCARBAMOL 500 MG PO TABS
ORAL_TABLET | ORAL | Status: AC
  Administered 2020-08-23: 500 mg via ORAL
  Filled 2020-08-23: qty 1

## 2020-08-23 MED ORDER — MUPIROCIN 2 % EX OINT
1.0000 "application " | TOPICAL_OINTMENT | Freq: Two times a day (BID) | CUTANEOUS | Status: DC
  Administered 2020-08-24 – 2020-08-25 (×3): 1 via NASAL
  Filled 2020-08-23 (×2): qty 22

## 2020-08-23 MED ORDER — LIDOCAINE 2% (20 MG/ML) 5 ML SYRINGE
INTRAMUSCULAR | Status: DC | PRN
  Administered 2020-08-23: 80 mg via INTRAVENOUS

## 2020-08-23 MED ORDER — FENTANYL CITRATE (PF) 100 MCG/2ML IJ SOLN
25.0000 ug | INTRAMUSCULAR | Status: DC | PRN
  Administered 2020-08-23: 50 ug via INTRAVENOUS

## 2020-08-23 MED ORDER — POLYETHYLENE GLYCOL 3350 17 G PO PACK: 17.0000 g | PACK | Freq: Every day | ORAL | Status: DC | PRN

## 2020-08-23 MED ORDER — DEXMEDETOMIDINE (PRECEDEX) IN NS 20 MCG/5ML (4 MCG/ML) IV SYRINGE
PREFILLED_SYRINGE | INTRAVENOUS | Status: DC | PRN
  Administered 2020-08-23: 8 ug via INTRAVENOUS
  Administered 2020-08-23: 4 ug via INTRAVENOUS
  Administered 2020-08-23: 8 ug via INTRAVENOUS

## 2020-08-23 MED ORDER — METHOCARBAMOL 500 MG PO TABS
500.0000 mg | ORAL_TABLET | Freq: Four times a day (QID) | ORAL | Status: DC | PRN
  Administered 2020-08-23 – 2020-08-25 (×4): 500 mg via ORAL
  Filled 2020-08-23 (×4): qty 1

## 2020-08-23 MED ORDER — LACTATED RINGERS IV BOLUS
2000.0000 mL | Freq: Once | INTRAVENOUS | Status: AC
  Administered 2020-08-23: 2000 mL via INTRAVENOUS

## 2020-08-23 MED ORDER — ROCURONIUM BROMIDE 10 MG/ML (PF) SYRINGE
PREFILLED_SYRINGE | INTRAVENOUS | Status: DC | PRN
  Administered 2020-08-23: 50 mg via INTRAVENOUS

## 2020-08-23 MED ORDER — ROCURONIUM BROMIDE 10 MG/ML (PF) SYRINGE
PREFILLED_SYRINGE | INTRAVENOUS | Status: AC
  Filled 2020-08-23: qty 10

## 2020-08-23 MED ORDER — CEFAZOLIN SODIUM 1 G IJ SOLR
INTRAMUSCULAR | Status: AC
  Filled 2020-08-23: qty 20

## 2020-08-23 MED ORDER — FENTANYL CITRATE (PF) 100 MCG/2ML IJ SOLN
100.0000 ug | Freq: Once | INTRAMUSCULAR | Status: AC
  Administered 2020-08-23: 100 ug via INTRAVENOUS
  Filled 2020-08-23: qty 2

## 2020-08-23 MED ORDER — ONDANSETRON HCL 4 MG/2ML IJ SOLN
INTRAMUSCULAR | Status: AC
  Filled 2020-08-23: qty 2

## 2020-08-23 MED ORDER — HYDROMORPHONE HCL 1 MG/ML IJ SOLN
0.5000 mg | INTRAMUSCULAR | Status: DC | PRN
  Administered 2020-08-23 – 2020-08-24 (×3): 1 mg via INTRAVENOUS
  Filled 2020-08-23 (×3): qty 1

## 2020-08-23 MED ORDER — LIDOCAINE-EPINEPHRINE-TETRACAINE (LET) TOPICAL GEL
3.0000 mL | Freq: Once | TOPICAL | Status: AC
  Administered 2020-08-23: 3 mL via TOPICAL
  Filled 2020-08-23: qty 3

## 2020-08-23 MED ORDER — PROPOFOL 10 MG/ML IV BOLUS
INTRAVENOUS | Status: AC
  Filled 2020-08-23: qty 20

## 2020-08-23 MED ORDER — VANCOMYCIN HCL 1000 MG IV SOLR
INTRAVENOUS | Status: DC | PRN
  Administered 2020-08-23: 1000 mg

## 2020-08-23 MED ORDER — TETANUS-DIPHTH-ACELL PERTUSSIS 5-2.5-18.5 LF-MCG/0.5 IM SUSP: 0.5000 mL | Freq: Once | INTRAMUSCULAR | Status: DC

## 2020-08-23 MED ORDER — IOHEXOL 350 MG/ML SOLN
100.0000 mL | Freq: Once | INTRAVENOUS | Status: AC | PRN
  Administered 2020-08-23: 100 mL via INTRAVENOUS

## 2020-08-23 MED ORDER — PHENYLEPHRINE 40 MCG/ML (10ML) SYRINGE FOR IV PUSH (FOR BLOOD PRESSURE SUPPORT)
PREFILLED_SYRINGE | INTRAVENOUS | Status: AC
  Filled 2020-08-23: qty 10

## 2020-08-23 MED ORDER — METOCLOPRAMIDE HCL 5 MG/ML IJ SOLN: 5.0000 mg | Freq: Three times a day (TID) | INTRAMUSCULAR | Status: DC | PRN

## 2020-08-23 MED ORDER — SUCCINYLCHOLINE CHLORIDE 200 MG/10ML IV SOSY
PREFILLED_SYRINGE | INTRAVENOUS | Status: DC | PRN
  Administered 2020-08-23: 140 mg via INTRAVENOUS

## 2020-08-23 MED ORDER — FENTANYL CITRATE (PF) 100 MCG/2ML IJ SOLN
INTRAMUSCULAR | Status: AC
  Administered 2020-08-23: 50 ug via INTRAVENOUS
  Filled 2020-08-23: qty 2

## 2020-08-23 MED ORDER — ONDANSETRON HCL 4 MG/2ML IJ SOLN
INTRAMUSCULAR | Status: DC | PRN
  Administered 2020-08-23: 4 mg via INTRAVENOUS

## 2020-08-23 MED ORDER — SUCCINYLCHOLINE CHLORIDE 200 MG/10ML IV SOSY
PREFILLED_SYRINGE | INTRAVENOUS | Status: AC
  Filled 2020-08-23: qty 10

## 2020-08-23 MED ORDER — CEFAZOLIN SODIUM-DEXTROSE 2-3 GM-%(50ML) IV SOLR
INTRAVENOUS | Status: DC | PRN
  Administered 2020-08-23: 2 g via INTRAVENOUS

## 2020-08-23 MED ORDER — SUGAMMADEX SODIUM 200 MG/2ML IV SOLN
INTRAVENOUS | Status: DC | PRN
  Administered 2020-08-23 (×2): 100 mg via INTRAVENOUS

## 2020-08-23 MED ORDER — FENTANYL CITRATE (PF) 250 MCG/5ML IJ SOLN
INTRAMUSCULAR | Status: AC
  Filled 2020-08-23: qty 5

## 2020-08-23 MED ORDER — ENOXAPARIN SODIUM 40 MG/0.4ML ~~LOC~~ SOLN
40.0000 mg | SUBCUTANEOUS | Status: DC
  Administered 2020-08-24 – 2020-08-25 (×2): 40 mg via SUBCUTANEOUS
  Filled 2020-08-23 (×2): qty 0.4

## 2020-08-23 MED ORDER — FENTANYL CITRATE (PF) 100 MCG/2ML IJ SOLN
50.0000 ug | INTRAMUSCULAR | Status: DC | PRN
  Administered 2020-08-23: 50 ug via INTRAVENOUS
  Filled 2020-08-23: qty 2

## 2020-08-23 MED ORDER — OXYCODONE HCL 5 MG PO TABS
10.0000 mg | ORAL_TABLET | ORAL | Status: DC | PRN
  Administered 2020-08-24 (×2): 15 mg via ORAL
  Administered 2020-08-24: 10 mg via ORAL
  Filled 2020-08-23 (×2): qty 3

## 2020-08-23 MED ORDER — CEFAZOLIN SODIUM-DEXTROSE 2-4 GM/100ML-% IV SOLN
2.0000 g | Freq: Three times a day (TID) | INTRAVENOUS | Status: AC
  Administered 2020-08-23 – 2020-08-24 (×3): 2 g via INTRAVENOUS
  Filled 2020-08-23 (×3): qty 100

## 2020-08-23 MED ORDER — POTASSIUM CHLORIDE IN NACL 20-0.9 MEQ/L-% IV SOLN
INTRAVENOUS | Status: DC
  Filled 2020-08-23: qty 1000

## 2020-08-23 MED ORDER — DEXMEDETOMIDINE (PRECEDEX) IN NS 20 MCG/5ML (4 MCG/ML) IV SYRINGE
PREFILLED_SYRINGE | INTRAVENOUS | Status: AC
  Filled 2020-08-23: qty 5

## 2020-08-23 MED ORDER — DEXAMETHASONE SODIUM PHOSPHATE 10 MG/ML IJ SOLN
INTRAMUSCULAR | Status: AC
  Filled 2020-08-23: qty 1

## 2020-08-23 MED ORDER — ALBUMIN HUMAN 5 % IV SOLN: INTRAVENOUS | Status: DC | PRN

## 2020-08-23 MED ORDER — PHENYLEPHRINE 40 MCG/ML (10ML) SYRINGE FOR IV PUSH (FOR BLOOD PRESSURE SUPPORT)
PREFILLED_SYRINGE | INTRAVENOUS | Status: DC | PRN
  Administered 2020-08-23: 80 ug via INTRAVENOUS

## 2020-08-23 MED ORDER — DIPHENHYDRAMINE HCL 50 MG/ML IJ SOLN
INTRAMUSCULAR | Status: AC
  Filled 2020-08-23: qty 1

## 2020-08-23 MED ORDER — 0.9 % SODIUM CHLORIDE (POUR BTL) OPTIME
TOPICAL | Status: DC | PRN
  Administered 2020-08-23: 1000 mL

## 2020-08-23 MED ORDER — OXYCODONE HCL 5 MG PO TABS
ORAL_TABLET | ORAL | Status: AC
Start: 2020-08-23 — End: 2020-08-23
  Administered 2020-08-23: 5 mg via ORAL
  Filled 2020-08-23: qty 1

## 2020-08-23 MED ORDER — METOCLOPRAMIDE HCL 5 MG PO TABS: 5.0000 mg | ORAL_TABLET | Freq: Three times a day (TID) | ORAL | Status: DC | PRN

## 2020-08-23 MED ORDER — ONDANSETRON HCL 4 MG PO TABS: 4.0000 mg | ORAL_TABLET | Freq: Four times a day (QID) | ORAL | Status: DC | PRN

## 2020-08-23 MED ORDER — DOCUSATE SODIUM 100 MG PO CAPS
100.0000 mg | ORAL_CAPSULE | Freq: Two times a day (BID) | ORAL | Status: DC
  Administered 2020-08-23 – 2020-08-25 (×4): 100 mg via ORAL
  Filled 2020-08-23 (×4): qty 1

## 2020-08-23 MED ORDER — PROPOFOL 10 MG/ML IV BOLUS
INTRAVENOUS | Status: DC | PRN
  Administered 2020-08-23: 150 mg via INTRAVENOUS

## 2020-08-23 MED ORDER — LIDOCAINE 2% (20 MG/ML) 5 ML SYRINGE
INTRAMUSCULAR | Status: AC
  Filled 2020-08-23: qty 5

## 2020-08-23 MED ORDER — METHOCARBAMOL 1000 MG/10ML IJ SOLN
500.0000 mg | Freq: Four times a day (QID) | INTRAVENOUS | Status: DC | PRN
  Filled 2020-08-23: qty 5

## 2020-08-23 MED ORDER — TOBRAMYCIN SULFATE 1.2 G IJ SOLR
INTRAMUSCULAR | Status: AC
  Filled 2020-08-23: qty 1.2

## 2020-08-23 MED ORDER — DIPHENHYDRAMINE HCL 50 MG/ML IJ SOLN
INTRAMUSCULAR | Status: DC | PRN
  Administered 2020-08-23: 12.5 mg via INTRAVENOUS

## 2020-08-23 MED ORDER — ONDANSETRON HCL 4 MG/2ML IJ SOLN: 4.0000 mg | Freq: Four times a day (QID) | INTRAMUSCULAR | Status: DC | PRN

## 2020-08-23 MED ORDER — MIDAZOLAM HCL 2 MG/2ML IJ SOLN
INTRAMUSCULAR | Status: AC
  Filled 2020-08-23: qty 2

## 2020-08-23 MED ORDER — FENTANYL CITRATE (PF) 250 MCG/5ML IJ SOLN
INTRAMUSCULAR | Status: DC | PRN
Start: 2020-08-23 — End: 2020-08-23
  Administered 2020-08-23 (×2): 50 ug via INTRAVENOUS

## 2020-08-23 MED ORDER — DEXAMETHASONE SODIUM PHOSPHATE 10 MG/ML IJ SOLN
INTRAMUSCULAR | Status: DC | PRN
  Administered 2020-08-23: 5 mg via INTRAVENOUS

## 2020-08-23 SURGICAL SUPPLY — 60 items
BIT DRILL CANN FLEX 14 (BIT) ×2 IMPLANT
BIT DRILL SHORT 4.2 (BIT) ×2 IMPLANT
BIT DRILL STEP 4.5X6.5 (BIT) ×1 IMPLANT
BLADE SURG 10 STRL SS (BLADE) ×4 IMPLANT
BNDG COHESIVE 4X5 TAN STRL (GAUZE/BANDAGES/DRESSINGS) ×2 IMPLANT
BRUSH SCRUB EZ  4% CHG (MISCELLANEOUS) ×4
BRUSH SCRUB EZ 4% CHG (MISCELLANEOUS) ×2 IMPLANT
CHLORAPREP W/TINT 26 (MISCELLANEOUS) ×4 IMPLANT
COVER SURGICAL LIGHT HANDLE (MISCELLANEOUS) ×2 IMPLANT
COVER WAND RF STERILE (DRAPES) ×2 IMPLANT
DERMABOND ADVANCED (GAUZE/BANDAGES/DRESSINGS) ×1
DERMABOND ADVANCED .7 DNX12 (GAUZE/BANDAGES/DRESSINGS) ×1 IMPLANT
DRAPE C-ARM 35X43 STRL (DRAPES) ×2 IMPLANT
DRAPE C-ARMOR (DRAPES) ×2 IMPLANT
DRAPE HALF SHEET 40X57 (DRAPES) ×4 IMPLANT
DRAPE IMP U-DRAPE 54X76 (DRAPES) ×4 IMPLANT
DRAPE INCISE IOBAN 66X45 STRL (DRAPES) ×2 IMPLANT
DRAPE ORTHO SPLIT 77X108 STRL (DRAPES) ×4
DRAPE SURG 17X23 STRL (DRAPES) ×2 IMPLANT
DRAPE SURG ORHT 6 SPLT 77X108 (DRAPES) ×2 IMPLANT
DRAPE U-SHAPE 47X51 STRL (DRAPES) ×2 IMPLANT
DRILL BIT SHORT 4.2 (BIT) ×4
DRILL BIT STEP 4.5X6.5 (BIT) ×2
DRSG MEPILEX BORDER 4X4 (GAUZE/BANDAGES/DRESSINGS) ×8 IMPLANT
ELECT REM PT RETURN 9FT ADLT (ELECTROSURGICAL) ×2
ELECTRODE REM PT RTRN 9FT ADLT (ELECTROSURGICAL) ×1 IMPLANT
GLOVE BIO SURGEON STRL SZ 6.5 (GLOVE) ×6 IMPLANT
GLOVE BIO SURGEON STRL SZ7.5 (GLOVE) ×6 IMPLANT
GLOVE BIOGEL PI IND STRL 6.5 (GLOVE) ×1 IMPLANT
GLOVE BIOGEL PI IND STRL 7.5 (GLOVE) ×2 IMPLANT
GLOVE BIOGEL PI INDICATOR 6.5 (GLOVE) ×1
GLOVE BIOGEL PI INDICATOR 7.5 (GLOVE) ×2
GOWN STRL REUS W/ TWL LRG LVL3 (GOWN DISPOSABLE) ×3 IMPLANT
GOWN STRL REUS W/ TWL XL LVL3 (GOWN DISPOSABLE) ×1 IMPLANT
GOWN STRL REUS W/TWL LRG LVL3 (GOWN DISPOSABLE) ×6
GOWN STRL REUS W/TWL XL LVL3 (GOWN DISPOSABLE) ×2
GUIDEWIRE 3.2X400 (WIRE) ×6 IMPLANT
KIT BASIN OR (CUSTOM PROCEDURE TRAY) ×2 IMPLANT
KIT TURNOVER KIT B (KITS) ×2 IMPLANT
MANIFOLD NEPTUNE II (INSTRUMENTS) ×2 IMPLANT
NAIL CANN FRN TI 10X380 RT (Nail) ×2 IMPLANT
NS IRRIG 1000ML POUR BTL (IV SOLUTION) ×2 IMPLANT
PACK GENERAL/GYN (CUSTOM PROCEDURE TRAY) ×2 IMPLANT
PAD ARMBOARD 7.5X6 YLW CONV (MISCELLANEOUS) ×4 IMPLANT
REAMER ROD DEEP FLUTE 2.5X950 (INSTRUMENTS) ×2 IMPLANT
SCREW 6.5MM W/T25 STARDRIVE (Screw) ×4 IMPLANT
SCREW LOCK STAR 5X42 (Screw) ×2 IMPLANT
SCREW LOCK STAR 5X44 (Screw) ×2 IMPLANT
STAPLER VISISTAT 35W (STAPLE) ×2 IMPLANT
STOCKINETTE IMPERVIOUS LG (DRAPES) ×2 IMPLANT
SUT ETHILON 3 0 PS 1 (SUTURE) ×2 IMPLANT
SUT MNCRL AB 3-0 PS2 18 (SUTURE) ×2 IMPLANT
SUT VIC AB 0 CT1 27 (SUTURE)
SUT VIC AB 0 CT1 27XBRD ANBCTR (SUTURE) IMPLANT
SUT VIC AB 2-0 CT1 27 (SUTURE) ×4
SUT VIC AB 2-0 CT1 TAPERPNT 27 (SUTURE) ×2 IMPLANT
TOWEL GREEN STERILE (TOWEL DISPOSABLE) ×4 IMPLANT
TOWEL GREEN STERILE FF (TOWEL DISPOSABLE) ×2 IMPLANT
UNDERPAD 30X36 HEAVY ABSORB (UNDERPADS AND DIAPERS) ×2 IMPLANT
WATER STERILE IRR 1000ML POUR (IV SOLUTION) ×2 IMPLANT

## 2020-08-23 NOTE — Anesthesia Procedure Notes (Signed)
Procedure Name: Intubation Date/Time: 08/23/2020 7:46 AM Performed by: Trinna Post., CRNA Pre-anesthesia Checklist: Patient identified, Emergency Drugs available, Suction available, Patient being monitored and Timeout performed Patient Re-evaluated:Patient Re-evaluated prior to induction Oxygen Delivery Method: Circle system utilized Preoxygenation: Pre-oxygenation with 100% oxygen Induction Type: IV induction, Rapid sequence and Cricoid Pressure applied Laryngoscope Size: Mac and 4 Grade View: Grade I Tube type: Oral Tube size: 7.5 mm Number of attempts: 1 Airway Equipment and Method: Stylet Placement Confirmation: ETT inserted through vocal cords under direct vision,  positive ETCO2 and breath sounds checked- equal and bilateral Secured at: 22 cm Tube secured with: Tape Dental Injury: Teeth and Oropharynx as per pre-operative assessment

## 2020-08-23 NOTE — Plan of Care (Signed)

## 2020-08-23 NOTE — Anesthesia Postprocedure Evaluation (Signed)
Anesthesia Post Note  Patient: Audiological scientist  Procedure(s) Performed: INTRAMEDULLARY (IM) NAIL FEMORAL (Right )     Patient location during evaluation: PACU Anesthesia Type: General Level of consciousness: awake Pain management: pain level controlled Vital Signs Assessment: post-procedure vital signs reviewed and stable Respiratory status: spontaneous breathing Cardiovascular status: stable Postop Assessment: no apparent nausea or vomiting Anesthetic complications: no   No complications documented.  Last Vitals:  Vitals:   08/23/20 0522 08/23/20 0935  BP: 123/76 125/65  Pulse: 85 85  Resp: 18 (!) 29  Temp: 37.3 C 36.8 C  SpO2: 100% 100%    Last Pain:  Vitals:   08/23/20 0935  TempSrc:   PainSc: Asleep                 Trask Vosler

## 2020-08-23 NOTE — Progress Notes (Signed)
PT Cancellation Note  Patient Details Name: Ryan Downs MRN: [id number] DOB: 09-06-1996   Cancelled Treatment:    Reason Eval/Treat Not Completed: Pain limiting ability to participate Pt reporting he is in too much pain to participate. Will follow up as schedule allows.   Farley Ly, PT, DPT  Acute Rehabilitation Services  Pager: [phone] Office: [phone]    [name] 08/23/2020, 2:42 PM

## 2020-08-23 NOTE — Progress Notes (Signed)
Paged attending and let him know about patient arrival on the unit at 0530,  but it went to voicemail, patient is NPO, CHG bath completed, MRSA PCR sent to LAB, pain med given, VSS, will continue to monitor.
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
## 2020-08-23 NOTE — Plan of Care (Signed)
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
## 2020-08-23 NOTE — Progress Notes (Signed)
   08/23/20 0028  Clinical Encounter Type  Visited With Patient not available  Visit Type Trauma  Referral From Nurse  Consult/Referral To Chaplain   Called down to check in. No family present. Nurse said they would call back, if needed.  This note was prepared by Chaplain Resident, Tacy Learn, MDiv. Chaplain remains available as needed through the on-call pager: [phone].
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
## 2020-08-23 NOTE — Progress Notes (Signed)
1130 Received pt from PACU, A&O x4. Right leg dressing dry and intact. Pain meds given as needed.

## 2020-08-24 ENCOUNTER — Encounter (HOSPITAL_COMMUNITY): Payer: Self-pay | Admitting: Student

## 2020-08-24 ENCOUNTER — Other Ambulatory Visit: Payer: Self-pay

## 2020-08-24 LAB — CBC
HCT: 35 % — ABNORMAL LOW (ref 39.0–52.0)
Hemoglobin: 11.4 g/dL — ABNORMAL LOW (ref 13.0–17.0)
MCH: 28 pg (ref 26.0–34.0)
MCHC: 32.6 g/dL (ref 30.0–36.0)
MCV: 86 fL (ref 80.0–100.0)
Platelets: 254 10*3/uL (ref 150–400)
RBC: 4.07 MIL/uL — ABNORMAL LOW (ref 4.22–5.81)
RDW: 12.5 % (ref 11.5–15.5)
WBC: 13.2 10*3/uL — ABNORMAL HIGH (ref 4.0–10.5)
nRBC: 0 % (ref 0.0–0.2)

## 2020-08-24 LAB — BASIC METABOLIC PANEL
Anion gap: 8 (ref 5–15)
BUN: 6 mg/dL (ref 6–20)
CO2: 27 mmol/L (ref 22–32)
Calcium: 8.6 mg/dL — ABNORMAL LOW (ref 8.9–10.3)
Chloride: 103 mmol/L (ref 98–111)
Creatinine, Ser: 1.01 mg/dL (ref 0.61–1.24)
GFR calc Af Amer: >60 mL/min (ref >60)
GFR calc non Af Amer: >60 mL/min (ref >60)
Glucose, Bld: 103 mg/dL — ABNORMAL HIGH (ref 70–99)
Potassium: 3.6 mmol/L (ref 3.5–5.1)
Sodium: 138 mmol/L (ref 135–145)

## 2020-08-24 LAB — VITAMIN D 25 HYDROXY (VIT D DEFICIENCY, FRACTURES): Vit D, 25-Hydroxy: 22.64 ng/mL — ABNORMAL LOW (ref 30–100)

## 2020-08-24 MED ORDER — HYDROMORPHONE HCL 1 MG/ML IJ SOLN
1.0000 mg | INTRAMUSCULAR | Status: DC | PRN
  Administered 2020-08-24 – 2020-08-25 (×3): 1 mg via INTRAVENOUS
  Filled 2020-08-24 (×3): qty 1

## 2020-08-24 MED ORDER — VITAMIN D 25 MCG (1000 UNIT) PO TABS
1000.0000 [IU] | ORAL_TABLET | Freq: Every day | ORAL | Status: DC
  Administered 2020-08-24 – 2020-08-25 (×2): 1000 [IU] via ORAL
  Filled 2020-08-24 (×2): qty 1

## 2020-08-24 MED ORDER — GABAPENTIN 100 MG PO CAPS
100.0000 mg | ORAL_CAPSULE | Freq: Three times a day (TID) | ORAL | Status: DC
  Administered 2020-08-24 – 2020-08-25 (×5): 100 mg via ORAL
  Filled 2020-08-24 (×5): qty 1

## 2020-08-24 MED ORDER — KETOROLAC TROMETHAMINE 15 MG/ML IJ SOLN
15.0000 mg | Freq: Four times a day (QID) | INTRAMUSCULAR | Status: AC
  Administered 2020-08-24 – 2020-08-25 (×5): 15 mg via INTRAVENOUS
  Filled 2020-08-24 (×5): qty 1
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
## 2020-08-24 NOTE — Discharge Instructions (Signed)
Orthopaedic Trauma Service Discharge Instructions   General Discharge Instructions  WEIGHT BEARING STATUS: Touchdown weightbearing right lower extremity  RANGE OF MOTION/ACTIVITY: Okay for knee and hip motion as tolerated  Wound Care: Incisions can be left open to air if there is no drainage. If incision continues to have drainage, follow wound care instructions below. Okay to shower if no drainage from incisions.  DVT/PE prophylaxis: Aspirin 325 mg twice daily  Diet: as you were eating previously.  Can use over the counter stool softeners and bowel preparations, such as Miralax, to help with bowel movements.  Narcotics can be constipating.  Be sure to drink plenty of fluids  PAIN MEDICATION USE AND EXPECTATIONS  You have likely been given narcotic medications to help control your pain.  After a traumatic event that results in an fracture (broken bone) with or without surgery, it is ok to use narcotic pain medications to help control one's pain.  We understand that everyone responds to pain differently and each individual patient will be evaluated on a regular basis for the continued need for narcotic medications. Ideally, narcotic medication use should last no more than 6-8 weeks (coinciding with fracture healing).   As a patient it is your responsibility as well to monitor narcotic medication use and report the amount and frequency you use these medications when you come to your office visit.   We would also advise that if you are using narcotic medications, you should take a dose prior to therapy to maximize you participation.  IF YOU ARE ON NARCOTIC MEDICATIONS IT IS NOT PERMISSIBLE TO OPERATE A MOTOR VEHICLE (MOTORCYCLE/CAR/TRUCK/MOPED) OR HEAVY MACHINERY DO NOT MIX NARCOTICS WITH OTHER CNS (CENTRAL NERVOUS SYSTEM) DEPRESSANTS SUCH AS ALCOHOL   STOP SMOKING OR USING NICOTINE PRODUCTS!!!!  As discussed nicotine severely impairs your body's ability to heal surgical and traumatic  wounds but also impairs bone healing.  Wounds and bone heal by forming microscopic blood vessels (angiogenesis) and nicotine is a vasoconstrictor (essentially, shrinks blood vessels).  Therefore, if vasoconstriction occurs to these microscopic blood vessels they essentially disappear and are unable to deliver necessary nutrients to the healing tissue.  This is one modifiable factor that you can do to dramatically increase your chances of healing your injury.    (This means no smoking, no nicotine gum, patches, etc)  DO NOT USE NONSTEROIDAL ANTI-INFLAMMATORY DRUGS (NSAID'S)  Using products such as Advil (ibuprofen), Aleve (naproxen), Motrin (ibuprofen) for additional pain control during fracture healing can delay and/or prevent the healing response.  If you would like to take over the counter (OTC) medication, Tylenol (acetaminophen) is ok.  However, some narcotic medications that are given for pain control contain acetaminophen as well. Therefore, you should not exceed more than 4000 mg of tylenol in a day if you do not have liver disease.  Also note that there are may OTC medicines, such as cold medicines and allergy medicines that my contain tylenol as well.  If you have any questions about medications and/or interactions please ask your doctor/PA or your pharmacist.      ICE AND ELEVATE INJURED/OPERATIVE EXTREMITY  Using ice and elevating the injured extremity above your heart can help with swelling and pain control.  Icing in a pulsatile fashion, such as 20 minutes on and 20 minutes off, can be followed.    Do not place ice directly on skin. Make sure there is a barrier between to skin and the ice pack.    Using frozen items such as  frozen peas works well as the conform nicely to the are that needs to be iced.  USE AN ACE WRAP OR TED HOSE FOR SWELLING CONTROL  In addition to icing and elevation, Ace wraps or TED hose are used to help limit and resolve swelling.  It is recommended to use Ace wraps or  TED hose until you are informed to stop.    When using Ace Wraps start the wrapping distally (farthest away from the body) and wrap proximally (closer to the body)   Example: If you had surgery on your leg or thing and you do not have a splint on, start the ace wrap at the toes and work your way up to the thigh        If you had surgery on your upper extremity and do not have a splint on, start the ace wrap at your fingers and work your way up to the upper arm    CALL THE OFFICE WITH ANY QUESTIONS OR CONCERNS: [phone]   VISIT OUR WEBSITE FOR ADDITIONAL INFORMATION: [email or web address]    Discharge Wound Care Instructions  Do NOT apply any ointments, solutions or lotions to pin sites or surgical wounds.  These prevent needed drainage and even though solutions like hydrogen peroxide kill bacteria, they also damage cells lining the pin sites that help fight infection.  Applying lotions or ointments can keep the wounds moist and can cause them to breakdown and open up as well. This can increase the risk for infection. When in doubt call the office.  Surgical incisions should be dressed daily.  If any drainage is noted, use one layer of adaptic, then gauze, Kerlix, and an ace wrap.  Once the incision is completely dry and without drainage, it may be left open to air out.  Showering may begin 36-48 hours later.  Cleaning gently with soap and water.  Traumatic wounds should be dressed daily as well.    One layer of adaptic, gauze, Kerlix, then ace wrap.  The adaptic can be discontinued once the draining has ceased    If you have a wet to dry dressing: wet the gauze with saline the squeeze as much saline out so the gauze is moist (not soaking wet), place moistened gauze over wound, then place a dry gauze over the moist one, followed by Kerlix wrap, then ace wrap.

## 2020-08-24 NOTE — Progress Notes (Signed)
Orthopedic Tech Progress Note Patient Details:  Ryan Downs 1996/05/27 [id number]  Ortho Devices Type of Ortho Device: Crutches Ortho Device/Splint Interventions: Adjustment   Post Interventions Patient Tolerated: Ambulated well Instructions Provided: Poper ambulation with device, Care of device   Donald Pore 08/24/2020, 10:18 AM

## 2020-08-24 NOTE — TOC Progression Note (Signed)
Transition of Care [facility]) - Progression Note    Patient Details  Name: Ryan Downs MRN: [id number] Date of Birth: 08-09-1996  Transition of Care [facility]) CM/SW Contact  Epifanio Lesches, RN Phone Number: 08/24/2020, 1:29 PM  Clinical Narrative:    DME wheelchair order noted. Pt without insurance.Referral made with [employer] for charity care ... approval pending.  TOC team will continue to monitor and follow.....     Expected Discharge Plan: Home/Self Care (outpatient PT) Barriers to Discharge: Continued Medical Work up  Expected Discharge Plan and Services Expected Discharge Plan: Home/Self Care (outpatient PT)   Discharge Planning Services: CM Consult                       DME Agency: [employer] Date DME Agency Contacted: 08/24/20 Time DME Agency Contacted: 1328 Representative spoke with at DME Agency: Ian Malkin via voice message   HH Agency: NA         Social Determinants of Health (SDOH) Interventions    Readmission Risk Interventions No flowsheet data found.
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
## 2020-08-24 NOTE — Plan of Care (Signed)
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
## 2020-08-25 LAB — BASIC METABOLIC PANEL
Anion gap: 5 (ref 5–15)
BUN: 10 mg/dL (ref 6–20)
CO2: 30 mmol/L (ref 22–32)
Calcium: 8.6 mg/dL — ABNORMAL LOW (ref 8.9–10.3)
Chloride: 102 mmol/L (ref 98–111)
Creatinine, Ser: 0.94 mg/dL (ref 0.61–1.24)
GFR calc Af Amer: >60 mL/min (ref >60)
GFR calc non Af Amer: >60 mL/min (ref >60)
Glucose, Bld: 100 mg/dL — ABNORMAL HIGH (ref 70–99)
Potassium: 3.7 mmol/L (ref 3.5–5.1)
Sodium: 137 mmol/L (ref 135–145)

## 2020-08-25 LAB — CBC
HCT: 29.6 % — ABNORMAL LOW (ref 39.0–52.0)
Hemoglobin: 9.2 g/dL — ABNORMAL LOW (ref 13.0–17.0)
MCH: 26.9 pg (ref 26.0–34.0)
MCHC: 31.1 g/dL (ref 30.0–36.0)
MCV: 86.5 fL (ref 80.0–100.0)
Platelets: 204 10*3/uL (ref 150–400)
RBC: 3.42 MIL/uL — ABNORMAL LOW (ref 4.22–5.81)
RDW: 12.7 % (ref 11.5–15.5)
WBC: 10.6 10*3/uL — ABNORMAL HIGH (ref 4.0–10.5)
nRBC: 0 % (ref 0.0–0.2)

## 2020-08-25 LAB — URINALYSIS, ROUTINE W REFLEX MICROSCOPIC
Bacteria, UA: NONE SEEN
Glucose, UA: NEGATIVE mg/dL
Hgb urine dipstick: NEGATIVE
Ketones, ur: 5 mg/dL — AB
Nitrite: NEGATIVE
Protein, ur: 100 mg/dL — AB
Specific Gravity, Urine: >1.046 — ABNORMAL HIGH (ref 1.005–1.030)
pH: 5 (ref 5.0–8.0)

## 2020-08-25 MED ORDER — VITAMIN D3 25 MCG PO TABS
1000.0000 [IU] | ORAL_TABLET | Freq: Every day | ORAL | 2 refills | Status: AC
Start: 2020-08-25 — End: ?

## 2020-08-25 MED ORDER — ASPIRIN EC 325 MG PO TBEC: 325.0000 mg | DELAYED_RELEASE_TABLET | Freq: Two times a day (BID) | ORAL | 0 refills | Status: AC

## 2020-08-25 MED ORDER — METHOCARBAMOL 500 MG PO TABS: 500.0000 mg | ORAL_TABLET | Freq: Four times a day (QID) | ORAL | 0 refills | Status: AC | PRN

## 2020-08-25 MED ORDER — GABAPENTIN 100 MG PO CAPS: 100.0000 mg | ORAL_CAPSULE | Freq: Three times a day (TID) | ORAL | 0 refills | Status: AC

## 2020-08-25 MED ORDER — OXYCODONE HCL 10 MG PO TABS
10.0000 mg | ORAL_TABLET | ORAL | 0 refills | Status: AC | PRN
Start: 2020-08-25 — End: ?

## 2020-08-25 MED FILL — VITAMIN D3 25 MCG TABS: 25 | 30 days supply | Qty: 30 | Fill #0

## 2020-08-25 MED FILL — oxyCODONE HCL 10 MG TABS: 10 | 7 days supply | Qty: 42 | Fill #0

## 2020-08-25 MED FILL — ASPIRIN EC 325 MG TABLET: 325 | 30 days supply | Qty: 60 | Fill #0

## 2020-08-25 MED FILL — METHOCARBAMOL 500 MG TABS: 500 | 7 days supply | Qty: 28 | Fill #0

## 2020-08-25 MED FILL — GABAPENTIN 100 MG CAPSULE: 100 | 10 days supply | Qty: 30 | Fill #0

## 2020-08-25 NOTE — TOC CAGE-AID Note (Signed)
Transition of Care [facility]) - CAGE-AID Screening   Patient Details  Name: Ryan Downs MRN: [id number] Date of Birth: 03-27-1996  Transition of [facility] CM/SW Contact:    Emeterio Reeve, [place] Phone Number: 08/25/2020, 1:16 PM   Clinical Narrative: CSW met with pt at bedside. CSW introduced self and explained her role at the hospital.  Pt denied alcohol use. Pt reports occasional marijuana use. csw offered resources, pt declined.    CAGE-AID Screening:    Have You Ever Felt You Ought to Cut Down on Your Drinking or Drug Use?: No Have People Annoyed You By Critizing Your Drinking Or Drug Use?: No Have You Felt Bad Or Guilty About Your Drinking Or Drug Use?: No Have You Ever Had a Drink or Used Drugs First Thing In The Morning to Steady Your Nerves or to Get Rid of a Hangover?: No CAGE-AID Score: 0  Substance Abuse Education Offered: Yes    Blima Ledger, [place] Social Worker [phone]
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
## 2020-08-26 NOTE — Plan of Care (Signed)
  Problem: Education: Goal: Knowledge of General Education information will improve Description Including pain rating scale, medication(s)/side effects and non-pharmacologic comfort measures Outcome: Progressing   Problem: Health Behavior/Discharge Planning: Goal: Ability to manage health-related needs will improve Outcome: Progressing   

## 2020-09-23 ENCOUNTER — Inpatient Hospital Stay: Payer: Self-pay | Admitting: Family Medicine

## 2020-10-20 ENCOUNTER — Inpatient Hospital Stay: Payer: Self-pay | Admitting: Family Medicine

## 2023-12-06 ENCOUNTER — Encounter (HOSPITAL_BASED_OUTPATIENT_CLINIC_OR_DEPARTMENT_OTHER): Payer: Self-pay

## 2023-12-06 ENCOUNTER — Other Ambulatory Visit: Payer: Self-pay

## 2023-12-06 ENCOUNTER — Emergency Department (HOSPITAL_BASED_OUTPATIENT_CLINIC_OR_DEPARTMENT_OTHER)
Admission: EM | Admit: 2023-12-06 | Discharge: 2023-12-06 | Disposition: A | Discharge status: Home / Self Care | Payer: Self-pay | Attending: Emergency Medicine | Admitting: Emergency Medicine

## 2023-12-06 DIAGNOSIS — I10 Essential (primary) hypertension: Secondary | ICD-10-CM | POA: Insufficient documentation

## 2023-12-06 DIAGNOSIS — F410 Panic disorder [episodic paroxysmal anxiety] without agoraphobia: Secondary | ICD-10-CM | POA: Insufficient documentation

## 2023-12-06 MED ORDER — HALOPERIDOL 5 MG PO TABS
10.0000 mg | ORAL_TABLET | Freq: Once | ORAL | Status: AC
  Administered 2023-12-06: 10 mg via ORAL
  Filled 2023-12-06: qty 2

## 2023-12-06 MED ORDER — HALOPERIDOL 10 MG PO TABS: 10.0000 mg | ORAL_TABLET | Freq: Two times a day (BID) | ORAL | 0 refills | Status: AC

## 2023-12-06 NOTE — ED Triage Notes (Signed)
Pt arrives with c/o anxiousness that started today. Pt has hx of anxiety. Per pt, he takes haldol and has misplaced his medication. Pt unsure of where his meds are and is very jumpy in triage. Pt denies pain. Pt reports being unable to sleep and that his body is trembling. Pt recently admitted for behavioral episode.

## 2023-12-06 NOTE — Discharge Instructions (Signed)
You can refill this prescription, please return if you have significant worsening anxiety, start to have thoughts that you want to hurt yourself or hurt anyone else.

## 2023-12-06 NOTE — ED Provider Notes (Signed)
Ryan Downs EM[facility]GENCY DEPARTMENT AT [facility] Provider Note   CSN: [id number] Arrival date & time: 12/06/23  1906     History  Chief Complaint  Patient presents with   Anxiety    Ryan Downs is a 27 y.o. male with past medical history significant for recent admission for psychosis who presents with concern for panic attack, anxiety, and missing doses of his Haldol.  He was discharged with oral 10 mg twice daily in addition to having received intramuscular Haldol and he is requesting a refill of his oral prescription.  He denies any SI, HI, AVH.   Anxiety       Home Medications Prior to Admission medications   Medication Sig Start Date End Date Taking? Authorizing Provider  haloperidol (HALDOL) 10 MG tablet Take 1 tablet (10 mg total) by mouth 2 (two) times daily. 12/06/23  Yes Brandan Robicheaux H, PA-C  cholecalciferol (VITAMIN D) 25 MCG tablet Take 1 tablet (1,000 Units total) by mouth daily. 08/25/20   West Bali, PA-C  clindamycin (CLEOCIN) 300 MG capsule Take 1 capsule (300 mg total) by mouth 4 (four) times daily. X 7 days 04/02/17   Mesner, Barbara Cower, MD  gabapentin (NEURONTIN) 100 MG capsule Take 1 capsule (100 mg total) by mouth 3 (three) times daily. 08/25/20   [place] Bali, PA-C  methocarbamol (ROBAXIN) 500 MG tablet Take 1 tablet (500 mg total) by mouth every 6 (six) hours as needed for muscle spasms. 08/25/20   West Bali, PA-C  oxyCODONE 10 MG TABS Take 1 tablet (10 mg total) by mouth every 4 (four) hours as needed for severe pain. 08/25/20   West Bali, PA-C  sulfamethoxazole-trimethoprim (BACTRIM DS,SEPTRA DS) 800-160 MG tablet Take 1 tablet by mouth 2 (two) times daily. 03/27/17 thru 04/06/17    [provider]  traMADol-acetaminophen (ULTRACET) 37.5-325 MG tablet Take 1 tablet by mouth 2 (two) times daily.    [provider]      Allergies    Patient has no known allergies.    Review of Systems   Review of Systems   All other systems reviewed and are negative.   Physical Exam Updated Vital Signs BP (!) 145/94 (BP Location: Left Arm)   Pulse (!) 102   Temp 98.7 F (37.1 C) (Oral)   Resp 16   Wt 79.4 kg   SpO2 100%   BMI 27.41 kg/m  Physical Exam Vitals and nursing note reviewed.  Constitutional:      General: He is not in acute distress.    Appearance: Normal appearance.  HENT:     Head: Normocephalic and atraumatic.  Eyes:     General:        Right eye: No discharge.        Left eye: No discharge.  Cardiovascular:     Rate and Rhythm: Normal rate and regular rhythm.     Heart sounds: No murmur heard.    No friction rub. No gallop.  Pulmonary:     Effort: Pulmonary effort is normal.     Breath sounds: Normal breath sounds.  Abdominal:     General: Bowel sounds are normal.     Palpations: Abdomen is soft.  Skin:    General: Skin is warm and dry.     Capillary Refill: Capillary refill takes less than 2 seconds.  Neurological:     Mental Status: He is alert and oriented to person, place, and time.  Psychiatric:  Mood and Affect: Mood normal.        Behavior: Behavior normal.     Comments: Patient is quite anxious, but responds to questions appropriately, no flight of ideas, denies SI, HI, AVH.     ED Results / Procedures / Treatments   Labs (all labs ordered are listed, but only abnormal results are displayed) Labs Reviewed - No data to display  EKG None  Radiology No results found.  Procedures Procedures    Medications Ordered in ED Medications  haloperidol (HALDOL) tablet 10 mg (10 mg Oral Given 12/06/23 1943)    ED Course/ Medical Decision Making/ A&P                                 Medical Decision Making Risk Prescription drug management.    This patient is a 27 y.o. male who presents to the ED for concern of Anxiety, panic attack, medication refill.   Differential diagnoses prior to evaluation: Anxiety, panic attack, SI, HI, versus  other  Past Medical History / Social History / Additional history: Chart reviewed. Pertinent results include: Reviewed his recent admission for psychosis, he was supposed to be taking oral Haldol in addition to having received a loading dose of IM Haldol, he reports he lost his prescription, I think is reasonable to give him oral Haldol today and refill his prescription  Physical Exam: Physical exam performed. The pertinent findings include: Patient is quite anxious but otherwise appears well, he has some mild hypertension, blood pressure 145/94, his pulse is elevated at 102 likely secondary to his anxiety.  He has no respiratory abnormalities, no wheezing, rhonchi, stridor, rales.  Medications / Treatment: Administered oral Haldol and refilled his prescription.   Disposition: After consideration of the diagnostic results and the patients response to treatment, I feel that patient stable for discharge with plan as above.   emergency department workup does not suggest an emergent condition requiring admission or immediate intervention beyond what has been performed at this time. The plan is: as above. The patient is safe for discharge and has been instructed to return immediately for worsening symptoms, change in symptoms or any other concerns.  Final Clinical Impression(s) / ED Diagnoses Final diagnoses:  Anxiety attack  Panic attack    Rx / DC Orders ED Discharge Orders          Ordered    haloperidol (HALDOL) 10 MG tablet  2 times daily        12/06/23 1932              West Bali 12/06/23 1946    Terrilee Files, MD 12/07/23 1016

## 2023-12-16 ENCOUNTER — Other Ambulatory Visit: Payer: Self-pay

## 2023-12-16 ENCOUNTER — Encounter (HOSPITAL_BASED_OUTPATIENT_CLINIC_OR_DEPARTMENT_OTHER): Payer: Self-pay

## 2023-12-16 ENCOUNTER — Emergency Department (HOSPITAL_BASED_OUTPATIENT_CLINIC_OR_DEPARTMENT_OTHER)
Admission: EM | Admit: 2023-12-16 | Discharge: 2023-12-16 | Disposition: A | Discharge status: Home / Self Care | Payer: Self-pay

## 2023-12-16 DIAGNOSIS — F419 Anxiety disorder, unspecified: Secondary | ICD-10-CM | POA: Insufficient documentation

## 2023-12-16 MED ORDER — HYDROXYZINE HCL 25 MG PO TABS
25.0000 mg | ORAL_TABLET | Freq: Once | ORAL | Status: AC
  Administered 2023-12-16: 25 mg via ORAL
  Filled 2023-12-16: qty 1

## 2023-12-16 NOTE — ED Provider Notes (Signed)
[place] [facility] AT [facility] Provider Note   CSN: [id number] Arrival date & time: 12/16/23  0808     History  Chief Complaint  Patient presents with   Anxiety    Ryan Downs is a 27 y.o. male.  27 year old male presenting emergency department with anxiety reports being placed on Haldol, states he has not been able to take it as it seemingly makes his anxiety worse.  No specific physical complaint today states that he just feels quite anxious.  Denies SI, HI endorses some visual hallucinations which reports this is baseline for him.  Denies drug use or alcohol use prior to arrival.   Anxiety       Home Medications Prior to Admission medications   Medication Sig Start Date End Date Taking? Authorizing Provider  cholecalciferol (VITAMIN D) 25 MCG tablet Take 1 tablet (1,000 Units total) by mouth daily. 08/25/20   West Bali, PA-C  clindamycin (CLEOCIN) 300 MG capsule Take 1 capsule (300 mg total) by mouth 4 (four) times daily. X 7 days 04/02/17   Mesner, Barbara Cower, MD  gabapentin (NEURONTIN) 100 MG capsule Take 1 capsule (100 mg total) by mouth 3 (three) times daily. 08/25/20   [place] Bali, PA-C  haloperidol (HALDOL) 10 MG tablet Take 1 tablet (10 mg total) by mouth 2 (two) times daily. 12/06/23   Prosperi, Christian H, PA-C  methocarbamol (ROBAXIN) 500 MG tablet Take 1 tablet (500 mg total) by mouth every 6 (six) hours as needed for muscle spasms. 08/25/20   [place] Bali, PA-C  oxyCODONE 10 MG TABS Take 1 tablet (10 mg total) by mouth every 4 (four) hours as needed for severe pain. 08/25/20   West Bali, PA-C  sulfamethoxazole-trimethoprim (BACTRIM DS,SEPTRA DS) 800-160 MG tablet Take 1 tablet by mouth 2 (two) times daily. 03/27/17 thru 04/06/17    [provider]  traMADol-acetaminophen (ULTRACET) 37.5-325 MG tablet Take 1 tablet by mouth 2 (two) times daily.    [provider]      Allergies    Patient has no known  allergies.    Review of Systems   Review of Systems  Physical Exam Updated Vital Signs BP (!) 144/79 (BP Location: Left Arm)   Pulse 85   Temp 98.9 F (37.2 C) (Oral)   Resp 16   Ht 5\' 7"  (1.702 m)   Wt 72.6 kg   SpO2 99%   BMI 25.06 kg/m  Physical Exam Vitals and nursing note reviewed.  Constitutional:      General: He is not in acute distress.    Appearance: He is not toxic-appearing.  HENT:     Head: Normocephalic.     Nose: Nose normal.  Eyes:     Conjunctiva/sclera: Conjunctivae normal.  Cardiovascular:     Rate and Rhythm: Normal rate and regular rhythm.  Pulmonary:     Effort: Pulmonary effort is normal.  Abdominal:     General: There is no distension.  Skin:    General: Skin is warm.  Neurological:     Mental Status: He is alert and oriented to person, place, and time.  Psychiatric:     Comments: Anxious.  Congruent affect.  Normal speech rate and volume.  Linear thought content.     ED Results / Procedures / Treatments   Labs (all labs ordered are listed, but only abnormal results are displayed) Labs Reviewed - No data to display  EKG None  Radiology No results found.  Procedures  Procedures    Medications Ordered in ED Medications  hydrOXYzine (ATARAX) tablet 25 mg (has no administration in time range)    ED Course/ Medical Decision Making/ A&P Clinical Course as of 12/16/23 0826  Sun Dec 16, 2023  1610 Discharged 12/18 for psychosis per chart review. From D/C summary: "Principal Problem: Bipolar I disorder, single manic episode, severe, with psychosis (CMD) Active Problems: Tetrahydrocannabinol (THC) use disorder, moderate, dependence (CMD) Pre-diabetes  Discharge Medications:    Medication List    START taking these medications   haloperidoL 10 mg tablet Commonly known as: HALDOL Take 1 tablet (10 mg total) by mouth 2 (two) times a day for 15 days.  haloperidol decanoate 50 mg/mL injection Commonly known as: HALDOL  DECANOATE Inject 4 mL (200 mg total) into the muscle once for 1 dose. Second loading dose of Haldol dec will be 12/17/23. Start taking on: January 17, 2024 "  [TY]    Clinical Course User Index [TY] Coral Spikes, DO                                 Medical Decision Making Well-appearing 27 year old male present emergency department for anxiety does have a history of bipolar with recent admission for substance-induced psychosis per chart review.  Vital signs reassuring.  Patient has no physical complaints today.  Denies SI, HI, but is having some hallucinations.  He does not appear to be in acute danger to himself or others at this time.  Encouraged him to take his prescribed psychiatric medications.  Give patient some Atarax here.  Patient given resources and encouraged to go to [facility]. Stable for discharge.   Risk Prescription drug management.         Final Clinical Impression(s) / ED Diagnoses Final diagnoses:  Anxiety    Rx / DC Orders ED Discharge Orders     None         Coral Spikes, DO 12/16/23 9604

## 2023-12-16 NOTE — Discharge Instructions (Signed)
[facility] for further help with your mental health needs.

## 2023-12-16 NOTE — ED Triage Notes (Signed)
States last month he took some acid and since then has had bad anxiety. States hasn't been taking prescribed medication because it makes his anxiety. Denies SI/HI. States not able to sleep
